# Patient Record
Sex: Male | Born: 1960 | Race: Black or African American | Hispanic: No | Smoking: Current every day smoker
Health system: Southern US, Community
[De-identification: ages and names within clinical notes are randomized; demographics above are authoritative.]

## PROBLEM LIST (undated history)

## (undated) DIAGNOSIS — S92009A Unspecified fracture of unspecified calcaneus, initial encounter for closed fracture: Secondary | ICD-10-CM

## (undated) DIAGNOSIS — I10 Essential (primary) hypertension: Secondary | ICD-10-CM

## (undated) DIAGNOSIS — E119 Type 2 diabetes mellitus without complications: Secondary | ICD-10-CM

## (undated) HISTORY — PX: TONSILLECTOMY: SUR1361

## (undated) HISTORY — PX: EYE SURGERY: SHX253

## (undated) HISTORY — PX: CYST EXCISION PERINEAL: SHX6278

---

## 1998-02-16 ENCOUNTER — Emergency Department (HOSPITAL_COMMUNITY): Admission: EM | Admit: 1998-02-16 | Discharge: 1998-02-16 | Payer: Self-pay | Admitting: Emergency Medicine

## 1998-11-21 ENCOUNTER — Emergency Department (HOSPITAL_COMMUNITY): Admission: EM | Admit: 1998-11-21 | Discharge: 1998-11-21 | Payer: Self-pay | Admitting: *Deleted

## 1998-11-21 ENCOUNTER — Encounter: Payer: Self-pay | Admitting: *Deleted

## 2000-07-15 ENCOUNTER — Encounter: Payer: Self-pay | Admitting: Emergency Medicine

## 2000-07-15 ENCOUNTER — Emergency Department (HOSPITAL_COMMUNITY): Admission: EM | Admit: 2000-07-15 | Discharge: 2000-07-15 | Payer: Self-pay | Admitting: Emergency Medicine

## 2000-08-27 ENCOUNTER — Emergency Department (HOSPITAL_COMMUNITY): Admission: EM | Admit: 2000-08-27 | Discharge: 2000-08-27 | Payer: Self-pay | Admitting: Emergency Medicine

## 2003-09-10 ENCOUNTER — Emergency Department (HOSPITAL_COMMUNITY): Admission: EM | Admit: 2003-09-10 | Discharge: 2003-09-10 | Payer: Self-pay | Admitting: Emergency Medicine

## 2003-10-20 ENCOUNTER — Ambulatory Visit (HOSPITAL_COMMUNITY): Admission: RE | Admit: 2003-10-20 | Discharge: 2003-10-20 | Payer: Self-pay | Admitting: General Surgery

## 2017-10-28 ENCOUNTER — Encounter (HOSPITAL_COMMUNITY): Payer: Self-pay

## 2017-10-28 ENCOUNTER — Emergency Department (HOSPITAL_COMMUNITY): Payer: 59

## 2017-10-28 ENCOUNTER — Emergency Department (HOSPITAL_COMMUNITY)
Admission: EM | Admit: 2017-10-28 | Discharge: 2017-10-28 | Disposition: A | Discharge status: Home / Self Care | Payer: 59 | Attending: Emergency Medicine | Admitting: Emergency Medicine

## 2017-10-28 DIAGNOSIS — R51 Headache: Secondary | ICD-10-CM | POA: Diagnosis present

## 2017-10-28 DIAGNOSIS — R079 Chest pain, unspecified: Secondary | ICD-10-CM | POA: Insufficient documentation

## 2017-10-28 DIAGNOSIS — R1032 Left lower quadrant pain: Secondary | ICD-10-CM | POA: Insufficient documentation

## 2017-10-28 LAB — COMPREHENSIVE METABOLIC PANEL
ALK PHOS: 81 U/L (ref 38–126)
ALT: 11 U/L — AB (ref 17–63)
ANION GAP: 7 (ref 5–15)
AST: 17 U/L (ref 15–41)
Albumin: 3.9 g/dL (ref 3.5–5.0)
BILIRUBIN TOTAL: 0.6 mg/dL (ref 0.3–1.2)
BUN: 9 mg/dL (ref 6–20)
CALCIUM: 9 mg/dL (ref 8.9–10.3)
CO2: 30 mmol/L (ref 22–32)
CREATININE: 1.15 mg/dL (ref 0.61–1.24)
Chloride: 105 mmol/L (ref 101–111)
GFR calc non Af Amer: >60 mL/min (ref >60)
GLUCOSE: 91 mg/dL (ref 65–99)
Potassium: 4.3 mmol/L (ref 3.5–5.1)
SODIUM: 142 mmol/L (ref 135–145)
TOTAL PROTEIN: 6.6 g/dL (ref 6.5–8.1)

## 2017-10-28 LAB — CBC WITH DIFFERENTIAL/PLATELET
Basophils Absolute: 0 10*3/uL (ref 0.0–0.1)
Basophils Relative: 0 %
Eosinophils Absolute: 0 10*3/uL (ref 0.0–0.7)
Eosinophils Relative: 0 %
HEMATOCRIT: 43.2 % (ref 39.0–52.0)
HEMOGLOBIN: 14.2 g/dL (ref 13.0–17.0)
LYMPHS ABS: 2.1 10*3/uL (ref 0.7–4.0)
LYMPHS PCT: 32 %
MCH: 31.3 pg (ref 26.0–34.0)
MCHC: 32.9 g/dL (ref 30.0–36.0)
MCV: 95.2 fL (ref 78.0–100.0)
MONOS PCT: 5 %
Monocytes Absolute: 0.4 10*3/uL (ref 0.1–1.0)
NEUTROS PCT: 63 %
Neutro Abs: 4.2 10*3/uL (ref 1.7–7.7)
Platelets: 223 10*3/uL (ref 150–400)
RBC: 4.54 MIL/uL (ref 4.22–5.81)
RDW: 14 % (ref 11.5–15.5)
WBC: 6.8 10*3/uL (ref 4.0–10.5)

## 2017-10-28 LAB — I-STAT TROPONIN, ED
TROPONIN I, POC: 0.01 ng/mL (ref 0.00–0.08)
Troponin i, poc: 0.01 ng/mL (ref 0.00–0.08)

## 2017-10-28 MED ORDER — MORPHINE SULFATE (PF) 4 MG/ML IV SOLN
4.0000 mg | Freq: Once | INTRAVENOUS | Status: AC
  Administered 2017-10-28: 4 mg via INTRAMUSCULAR
  Filled 2017-10-28: qty 1

## 2017-10-28 MED ORDER — IOHEXOL 300 MG/ML  SOLN
100.0000 mL | Freq: Once | INTRAMUSCULAR | Status: AC | PRN
  Administered 2017-10-28: 100 mL via INTRAVENOUS

## 2017-10-28 MED ORDER — ASPIRIN 81 MG PO CHEW
324.0000 mg | CHEWABLE_TABLET | Freq: Once | ORAL | Status: AC
  Administered 2017-10-28: 324 mg via ORAL
  Filled 2017-10-28: qty 4

## 2017-10-28 MED ORDER — MORPHINE SULFATE (PF) 4 MG/ML IV SOLN
4.0000 mg | Freq: Once | INTRAVENOUS | Status: AC
  Administered 2017-10-28: 4 mg via INTRAVENOUS
  Filled 2017-10-28: qty 1

## 2017-10-28 MED ORDER — METHOCARBAMOL 750 MG PO TABS: 750.0000 mg | ORAL_TABLET | Freq: Three times a day (TID) | ORAL | 0 refills | Status: DC | PRN

## 2017-10-28 NOTE — ED Notes (Signed)
Patient transported to CT 

## 2017-10-28 NOTE — Discharge Instructions (Addendum)
Please take Ibuprofen (Advil, motrin) and Tylenol (acetaminophen) to relieve your pain.  You may take up to 600 MG (3 pills) of normal strength ibuprofen every 8 hours as needed.  In between doses of ibuprofen you make take tylenol, up to 1,000 mg (two extra strength pills).  Do not take more than 3,000 mg tylenol in a 24 hour period.  Please check all medication labels as many medications such as pain and cold medications may contain tylenol.  Do not drink alcohol while taking these medications.  Do not take other NSAID'S while taking ibuprofen (such as aleve or naproxen).  Please take ibuprofen with food to decrease stomach upset.  Today you received medications that may make you sleepy or impair your ability to make decisions.  For the next 24 hours please do not drive, operate heavy machinery, care for a small child with out another adult present, or perform any activities that may cause harm to you or someone else if you were to fall asleep or be impaired.   You are being prescribed a medication which may make you sleepy. Please follow up of listed precautions for at least 24 hours after taking one dose.  The best way to get rid of muscle pain is by taking NSAIDS, using heat, massage therapy, and gentle stretching/range of motion exercises.  Please follow up with your primary care provider within 5-7 days for re-evaluation of your symptoms. If you do not have a primary care provider, information for a healthcare clinic has been provided for you to make arrangements for follow up care. Please return to the emergency department for any symptoms including chest pain, shortness of breath, lightheadedness, or any new or worsening symptoms.

## 2017-10-28 NOTE — ED Notes (Signed)
ED Provider at bedside. 
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
## 2017-10-28 NOTE — ED Notes (Signed)
Pt ambulated to bathroom without difficulty.

## 2018-08-14 ENCOUNTER — Other Ambulatory Visit: Payer: Self-pay

## 2018-08-14 ENCOUNTER — Encounter (HOSPITAL_COMMUNITY): Payer: Self-pay

## 2018-08-14 ENCOUNTER — Emergency Department (HOSPITAL_COMMUNITY): Payer: 59

## 2018-08-14 ENCOUNTER — Emergency Department (HOSPITAL_COMMUNITY)
Admission: EM | Admit: 2018-08-14 | Discharge: 2018-08-14 | Disposition: A | Discharge status: Home / Self Care | Payer: 59 | Attending: Emergency Medicine | Admitting: Emergency Medicine

## 2018-08-14 DIAGNOSIS — W174XXA Fall from dock, initial encounter: Secondary | ICD-10-CM | POA: Diagnosis not present

## 2018-08-14 DIAGNOSIS — Y999 Unspecified external cause status: Secondary | ICD-10-CM | POA: Insufficient documentation

## 2018-08-14 DIAGNOSIS — Y929 Unspecified place or not applicable: Secondary | ICD-10-CM | POA: Insufficient documentation

## 2018-08-14 DIAGNOSIS — S92215A Nondisplaced fracture of cuboid bone of left foot, initial encounter for closed fracture: Secondary | ICD-10-CM | POA: Insufficient documentation

## 2018-08-14 DIAGNOSIS — S92011A Displaced fracture of body of right calcaneus, initial encounter for closed fracture: Secondary | ICD-10-CM | POA: Insufficient documentation

## 2018-08-14 DIAGNOSIS — Y9389 Activity, other specified: Secondary | ICD-10-CM | POA: Diagnosis not present

## 2018-08-14 DIAGNOSIS — S99912A Unspecified injury of left ankle, initial encounter: Secondary | ICD-10-CM | POA: Diagnosis present

## 2018-08-14 MED ORDER — OXYCODONE-ACETAMINOPHEN 5-325 MG PO TABS: 1.0000 | ORAL_TABLET | ORAL | 0 refills | Status: DC | PRN

## 2018-08-14 MED ORDER — OXYCODONE-ACETAMINOPHEN 5-325 MG PO TABS
1.0000 | ORAL_TABLET | ORAL | Status: AC | PRN
  Administered 2018-08-14 (×2): 1 via ORAL
  Filled 2018-08-14 (×2): qty 1

## 2018-08-14 NOTE — ED Triage Notes (Signed)
Pt reports L ankle pain. He reports stepping off a dock and rolling it. Ankle swollen. Not ambulatory,

## 2018-08-14 NOTE — ED Provider Notes (Signed)
[facility]-EMERGENCY DEPT Provider Note   CSN: [id number] Arrival date & time: 08/14/18  1929    History   Chief Complaint Chief Complaint  Patient presents with  . Ankle Pain    L    HPI Martin Freeman is a 58 y.o. male.     Pt presents to the ED today with right ankle pain.  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  The pt denies any other injuries.     History reviewed. No pertinent past medical history.  There are no active problems to display for this patient.   History reviewed. No pertinent surgical history.      Home Medications    Prior to Admission medications   Medication Sig Start Date End Date Taking? Authorizing Provider  methocarbamol (ROBAXIN) 750 MG tablet Take 1-2 tablets (750-1,500 mg total) by mouth 3 (three) times daily as needed for muscle spasms. 10/28/17   Cristina Gong, PA-C  oxyCODONE-acetaminophen (PERCOCET/ROXICET) 5-325 MG tablet Take 1-2 tablets by mouth every 4 (four) hours as needed for severe pain. 08/14/18   Jacalyn Lefevre, MD    Family History History reviewed. No pertinent family history.  Social History Social History   Tobacco Use  . Smoking status: Not on file  Substance Use Topics  . Alcohol use: Not on file  . Drug use: Not on file     Allergies   Patient has no known allergies.   Review of Systems Review of Systems  Musculoskeletal:       Right ankle pain  All other systems reviewed and are negative.    Physical Exam Updated Vital Signs BP (!) 150/92 (BP Location: Left Arm)   Pulse 90   Temp 98.9 F (37.2 C) (Oral)   Resp 18   Ht 6\' 3"  (1.905 m)   Wt 99.8 kg   SpO2 100%   BMI 27.50 kg/m   Physical Exam Vitals signs and nursing note reviewed.  Constitutional:      Appearance: Normal appearance.  HENT:     Head: Normocephalic and atraumatic.     Right Ear: External ear normal.     Left  Ear: External ear normal.     Nose: Nose normal.     Mouth/Throat:     Mouth: Mucous membranes are moist.  Eyes:     Pupils: Pupils are equal, round, and reactive to light.  Neck:     Musculoskeletal: Normal range of motion and neck supple.  Cardiovascular:     Rate and Rhythm: Normal rate and regular rhythm.     Pulses: Normal pulses.     Heart sounds: Normal heart sounds.  Pulmonary:     Effort: Pulmonary effort is normal.     Breath sounds: Normal breath sounds.  Abdominal:     General: Abdomen is flat.  Musculoskeletal:     Right ankle: He exhibits decreased range of motion and swelling. Tenderness.  Skin:    General: Skin is warm.     Capillary Refill: Capillary refill takes less than 2 seconds.  Neurological:     General: No focal deficit present.     Mental Status: He is alert and oriented to person, place, and time.  Psychiatric:        Mood and Affect: Mood normal.        Behavior: Behavior normal.      ED Treatments / Results  Labs (all labs ordered are listed, but only abnormal results are displayed) Labs Reviewed - No data to display  EKG None  Radiology Dg Ankle Complete Left  Result Date: 08/14/2018 CLINICAL DATA:  Left ankle pain EXAM: LEFT ANKLE COMPLETE - 3+ VIEW COMPARISON:  None. FINDINGS: Comminuted fracture noted through the left calcaneus. Should possible fracture of the adjacent cuboid seen only on the oblique view. Diffuse soft tissue swelling. No visible additional fracture, subluxation or dislocation. IMPRESSION: Comminuted fracture through the left calcaneus. Possible fracture within the left cuboid. Electronically Signed   By: Charlett Nose M.D.   On: 08/14/2018 20:42    Procedures Procedures (including critical care time)  Medications Ordered in ED Medications  oxyCODONE-acetaminophen (PERCOCET/ROXICET) 5-325 MG per tablet 1 tablet (1 tablet Oral Given 08/14/18 2003)     Initial Impression / Assessment and Plan / ED Course  I have  reviewed the triage vital signs and the nursing notes.  Pertinent labs & imaging results that were available during my care of the patient were reviewed by me and considered in my medical decision making (see chart for details).       Pt placed in a posterior splint and given crutches.  He has seen [facility] in the past and wishes to go back there.  The pt is instructed to stay [facility].  Return if worse.    Final Clinical Impressions(s) / ED Diagnoses   Final diagnoses:  Closed displaced fracture of body of right calcaneus, initial encounter  Closed nondisplaced fracture of cuboid of left foot, initial encounter    ED Discharge Orders         Ordered    oxyCODONE-acetaminophen (PERCOCET/ROXICET) 5-325 MG tablet  Every 4 hours PRN     08/14/18 2105           Jacalyn Lefevre, MD 08/14/18 2108

## 2018-08-16 ENCOUNTER — Ambulatory Visit
Admission: RE | Admit: 2018-08-16 | Discharge: 2018-08-16 | Disposition: A | Discharge status: Home / Self Care | Payer: 59 | Source: Ambulatory Visit | Attending: Student | Admitting: Student

## 2018-08-16 ENCOUNTER — Other Ambulatory Visit: Payer: Self-pay | Admitting: Student

## 2018-08-16 DIAGNOSIS — S92002A Unspecified fracture of left calcaneus, initial encounter for closed fracture: Secondary | ICD-10-CM

## 2018-08-17 ENCOUNTER — Other Ambulatory Visit: Payer: Self-pay | Admitting: Student

## 2018-08-17 DIAGNOSIS — S92002A Unspecified fracture of left calcaneus, initial encounter for closed fracture: Secondary | ICD-10-CM

## 2018-08-20 ENCOUNTER — Other Ambulatory Visit: Payer: Self-pay

## 2018-08-20 ENCOUNTER — Other Ambulatory Visit (HOSPITAL_COMMUNITY): Payer: Self-pay | Admitting: Orthopedic Surgery

## 2018-08-20 ENCOUNTER — Encounter (HOSPITAL_BASED_OUTPATIENT_CLINIC_OR_DEPARTMENT_OTHER): Payer: Self-pay | Admitting: *Deleted

## 2018-08-25 ENCOUNTER — Ambulatory Visit (HOSPITAL_BASED_OUTPATIENT_CLINIC_OR_DEPARTMENT_OTHER)
Admission: RE | Admit: 2018-08-25 | Discharge: 2018-08-25 | Disposition: A | Discharge status: Home / Self Care | Payer: Managed Care, Other (non HMO) | Attending: Orthopedic Surgery | Admitting: Orthopedic Surgery

## 2018-08-25 ENCOUNTER — Ambulatory Visit (HOSPITAL_BASED_OUTPATIENT_CLINIC_OR_DEPARTMENT_OTHER): Payer: Managed Care, Other (non HMO) | Admitting: Certified Registered"

## 2018-08-25 ENCOUNTER — Other Ambulatory Visit: Payer: Self-pay

## 2018-08-25 ENCOUNTER — Encounter (HOSPITAL_BASED_OUTPATIENT_CLINIC_OR_DEPARTMENT_OTHER): Payer: Self-pay | Admitting: Certified Registered"

## 2018-08-25 ENCOUNTER — Encounter (HOSPITAL_BASED_OUTPATIENT_CLINIC_OR_DEPARTMENT_OTHER)
Admission: RE | Disposition: A | Discharge status: Home / Self Care | Payer: Self-pay | Source: Home / Self Care | Attending: Orthopedic Surgery

## 2018-08-25 DIAGNOSIS — I10 Essential (primary) hypertension: Secondary | ICD-10-CM | POA: Diagnosis not present

## 2018-08-25 DIAGNOSIS — W1789XA Other fall from one level to another, initial encounter: Secondary | ICD-10-CM | POA: Insufficient documentation

## 2018-08-25 DIAGNOSIS — S92062A Displaced intraarticular fracture of left calcaneus, initial encounter for closed fracture: Secondary | ICD-10-CM | POA: Diagnosis not present

## 2018-08-25 DIAGNOSIS — F17210 Nicotine dependence, cigarettes, uncomplicated: Secondary | ICD-10-CM | POA: Insufficient documentation

## 2018-08-25 HISTORY — PX: ORIF CALCANEOUS FRACTURE: SHX5030

## 2018-08-25 HISTORY — DX: Essential (primary) hypertension: I10

## 2018-08-25 HISTORY — DX: Unspecified fracture of unspecified calcaneus, initial encounter for closed fracture: S92.009A

## 2018-08-25 SURGERY — OPEN REDUCTION INTERNAL FIXATION (ORIF) CALCANEOUS FRACTURE
Anesthesia: General | Site: Foot | Laterality: Left

## 2018-08-25 MED ORDER — ROPIVACAINE HCL 5 MG/ML IJ SOLN
INTRAMUSCULAR | Status: DC | PRN
  Administered 2018-08-25: 30 mL via PERINEURAL

## 2018-08-25 MED ORDER — CHLORHEXIDINE GLUCONATE 4 % EX LIQD: 60.0000 mL | Freq: Once | CUTANEOUS | Status: DC

## 2018-08-25 MED ORDER — SCOPOLAMINE 1 MG/3DAYS TD PT72: 1.0000 | MEDICATED_PATCH | Freq: Once | TRANSDERMAL | Status: DC | PRN

## 2018-08-25 MED ORDER — FENTANYL CITRATE (PF) 100 MCG/2ML IJ SOLN
INTRAMUSCULAR | Status: AC
  Filled 2018-08-25: qty 2

## 2018-08-25 MED ORDER — MIDAZOLAM HCL 2 MG/2ML IJ SOLN
1.0000 mg | INTRAMUSCULAR | Status: DC | PRN
  Administered 2018-08-25: 2 mg via INTRAVENOUS

## 2018-08-25 MED ORDER — LACTATED RINGERS IV SOLN
INTRAVENOUS | Status: DC
  Administered 2018-08-25 (×2): via INTRAVENOUS

## 2018-08-25 MED ORDER — FENTANYL CITRATE (PF) 100 MCG/2ML IJ SOLN
50.0000 ug | INTRAMUSCULAR | Status: DC | PRN
  Administered 2018-08-25: 50 ug via INTRAVENOUS

## 2018-08-25 MED ORDER — SODIUM CHLORIDE 0.9 % IV SOLN: INTRAVENOUS | Status: DC

## 2018-08-25 MED ORDER — OXYCODONE HCL 5 MG PO TABS: 5.0000 mg | ORAL_TABLET | Freq: Four times a day (QID) | ORAL | 0 refills | Status: AC | PRN

## 2018-08-25 MED ORDER — VANCOMYCIN HCL 500 MG IV SOLR
INTRAVENOUS | Status: DC | PRN
  Administered 2018-08-25: 1000 mg

## 2018-08-25 MED ORDER — DEXAMETHASONE SODIUM PHOSPHATE 10 MG/ML IJ SOLN
INTRAMUSCULAR | Status: DC | PRN
  Administered 2018-08-25: 10 mg via INTRAVENOUS

## 2018-08-25 MED ORDER — MIDAZOLAM HCL 2 MG/2ML IJ SOLN
INTRAMUSCULAR | Status: AC
  Filled 2018-08-25: qty 2

## 2018-08-25 MED ORDER — PROPOFOL 10 MG/ML IV BOLUS
INTRAVENOUS | Status: DC | PRN
  Administered 2018-08-25: 200 mg via INTRAVENOUS

## 2018-08-25 MED ORDER — ASPIRIN EC 81 MG PO TBEC: 81.0000 mg | DELAYED_RELEASE_TABLET | Freq: Two times a day (BID) | ORAL | 0 refills | Status: AC

## 2018-08-25 MED ORDER — CEFAZOLIN SODIUM-DEXTROSE 2-4 GM/100ML-% IV SOLN
INTRAVENOUS | Status: AC
  Filled 2018-08-25: qty 100

## 2018-08-25 MED ORDER — PROPOFOL 500 MG/50ML IV EMUL
INTRAVENOUS | Status: DC | PRN
  Administered 2018-08-25: 25 ug/kg/min via INTRAVENOUS

## 2018-08-25 MED ORDER — LIDOCAINE HCL (CARDIAC) PF 100 MG/5ML IV SOSY
PREFILLED_SYRINGE | INTRAVENOUS | Status: DC | PRN
  Administered 2018-08-25: 30 mg via INTRAVENOUS

## 2018-08-25 MED ORDER — CEFAZOLIN SODIUM-DEXTROSE 2-4 GM/100ML-% IV SOLN
2.0000 g | INTRAVENOUS | Status: AC
  Administered 2018-08-25: 2 g via INTRAVENOUS

## 2018-08-25 MED ORDER — ONDANSETRON HCL 4 MG/2ML IJ SOLN
INTRAMUSCULAR | Status: DC | PRN
  Administered 2018-08-25: 4 mg via INTRAVENOUS

## 2018-08-25 SURGICAL SUPPLY — 73 items
BANDAGE ESMARK 6X9 LF (GAUZE/BANDAGES/DRESSINGS) ×1 IMPLANT
BIT DRILL CALIBRATED 2.7 (BIT) ×2 IMPLANT
BIT DRILL CALIBRATED 2.7MM (BIT) ×1
BLADE SURG 10 STRL SS (BLADE) ×2 IMPLANT
BLADE SURG 15 STRL LF DISP TIS (BLADE) ×2 IMPLANT
BLADE SURG 15 STRL SS (BLADE) ×9
BNDG CMPR 9X6 STRL LF SNTH (GAUZE/BANDAGES/DRESSINGS) ×1
BNDG COHESIVE 4X5 TAN STRL (GAUZE/BANDAGES/DRESSINGS) ×3 IMPLANT
BNDG COHESIVE 6X5 TAN STRL LF (GAUZE/BANDAGES/DRESSINGS) ×3 IMPLANT
BNDG ESMARK 6X9 LF (GAUZE/BANDAGES/DRESSINGS) ×3
CHLORAPREP W/TINT 26ML (MISCELLANEOUS) ×3 IMPLANT
COVER BACK TABLE 60X90IN (DRAPES) ×3 IMPLANT
COVER WAND RF STERILE (DRAPES) IMPLANT
CUFF TOURNIQUET SINGLE 34IN LL (TOURNIQUET CUFF) ×3 IMPLANT
DECANTER SPIKE VIAL GLASS SM (MISCELLANEOUS) IMPLANT
DRAPE EXTREMITY T 121X128X90 (DISPOSABLE) ×3 IMPLANT
DRAPE OEC MINIVIEW 54X84 (DRAPES) ×3 IMPLANT
DRAPE U-SHAPE 47X51 STRL (DRAPES) ×3 IMPLANT
DRSG MEPITEL 4X7.2 (GAUZE/BANDAGES/DRESSINGS) ×3 IMPLANT
DRSG PAD ABDOMINAL 8X10 ST (GAUZE/BANDAGES/DRESSINGS) ×6 IMPLANT
ELECT REM PT RETURN 9FT ADLT (ELECTROSURGICAL) ×3
ELECTRODE REM PT RTRN 9FT ADLT (ELECTROSURGICAL) ×1 IMPLANT
GAUZE SPONGE 4X4 12PLY STRL (GAUZE/BANDAGES/DRESSINGS) ×3 IMPLANT
GLOVE BIO SURGEON STRL SZ 6.5 (GLOVE) ×2 IMPLANT
GLOVE BIO SURGEON STRL SZ8 (GLOVE) ×3 IMPLANT
GLOVE BIO SURGEONS STRL SZ 6.5 (GLOVE) ×2
GLOVE BIOGEL PI IND STRL 7.0 (GLOVE) IMPLANT
GLOVE BIOGEL PI IND STRL 8 (GLOVE) ×2 IMPLANT
GLOVE BIOGEL PI INDICATOR 7.0 (GLOVE) ×2
GLOVE BIOGEL PI INDICATOR 8 (GLOVE) ×4
GLOVE ECLIPSE 8.0 STRL XLNG CF (GLOVE) ×3 IMPLANT
GOWN STRL REUS W/ TWL LRG LVL3 (GOWN DISPOSABLE) ×1 IMPLANT
GOWN STRL REUS W/ TWL XL LVL3 (GOWN DISPOSABLE) ×2 IMPLANT
GOWN STRL REUS W/TWL LRG LVL3 (GOWN DISPOSABLE) ×3
GOWN STRL REUS W/TWL XL LVL3 (GOWN DISPOSABLE) ×6
K-WIRE ACE 1.6X6 (WIRE) ×18
KWIRE ACE 1.6X6 (WIRE) IMPLANT
NS IRRIG 1000ML POUR BTL (IV SOLUTION) ×3 IMPLANT
PACK BASIN DAY SURGERY FS (CUSTOM PROCEDURE TRAY) ×3 IMPLANT
PAD CAST 4YDX4 CTTN HI CHSV (CAST SUPPLIES) ×1 IMPLANT
PADDING CAST ABS 4INX4YD NS (CAST SUPPLIES) ×2
PADDING CAST ABS COTTON 4X4 ST (CAST SUPPLIES) ×1 IMPLANT
PADDING CAST COTTON 4X4 STRL (CAST SUPPLIES) ×3
PADDING CAST COTTON 6X4 STRL (CAST SUPPLIES) ×3 IMPLANT
PENCIL BUTTON HOLSTER BLD 10FT (ELECTRODE) ×3 IMPLANT
PLATE CALC LRG 3H RT LOCKING (Plate) ×3 IMPLANT
SANITIZER HAND PURELL 535ML FO (MISCELLANEOUS) ×3 IMPLANT
SCREW LOCK CORT STAR 3.5X38 (Screw) ×2 IMPLANT
SCREW LOCK CORT STAR 3.5X40 (Screw) ×2 IMPLANT
SCREW T15 LP CORT 3.5X38MM NS (Screw) ×2 IMPLANT
SCREW T15 LP CORT 3.5X40MM NS (Screw) ×4 IMPLANT
SCREW T15 LP CORT 3.5X42MM NS (Screw) ×2 IMPLANT
SCREW T15 LP CORT 3.5X44MM NS (Screw) ×4 IMPLANT
SHEET MEDIUM DRAPE 40X70 STRL (DRAPES) ×3 IMPLANT
SLEEVE SCD COMPRESS KNEE MED (MISCELLANEOUS) ×3 IMPLANT
SPLINT FAST PLASTER 5X30 (CAST SUPPLIES) ×40
SPLINT PLASTER CAST FAST 5X30 (CAST SUPPLIES) ×20 IMPLANT
SPONGE LAP 18X18 RF (DISPOSABLE) ×3 IMPLANT
STOCKINETTE 6  STRL (DRAPES) ×2
STOCKINETTE 6 STRL (DRAPES) ×1 IMPLANT
SUCTION FRAZIER HANDLE 10FR (MISCELLANEOUS) ×2
SUCTION TUBE FRAZIER 10FR DISP (MISCELLANEOUS) ×1 IMPLANT
SUT ETHILON 3 0 PS 1 (SUTURE) ×3 IMPLANT
SUT MNCRL AB 3-0 PS2 18 (SUTURE) ×3 IMPLANT
SUT VIC AB 0 SH 27 (SUTURE) ×3 IMPLANT
SUT VIC AB 2-0 SH 18 (SUTURE) IMPLANT
SUT VIC AB 2-0 SH 27 (SUTURE)
SUT VIC AB 2-0 SH 27XBRD (SUTURE) IMPLANT
SYR BULB 3OZ (MISCELLANEOUS) ×3 IMPLANT
TOWEL GREEN STERILE FF (TOWEL DISPOSABLE) ×6 IMPLANT
TUBE CONNECTING 20'X1/4 (TUBING) ×1
TUBE CONNECTING 20X1/4 (TUBING) ×2 IMPLANT
UNDERPAD 30X30 (UNDERPADS AND DIAPERS) ×3 IMPLANT

## 2018-08-25 NOTE — Anesthesia Procedure Notes (Signed)
Anesthesia Regional Block: Popliteal block   Pre-Anesthetic Checklist: ,, timeout performed, Correct Patient, Correct Site, Correct Laterality, Correct Procedure, Correct Position, site marked, Risks and benefits discussed,  Surgical consent,  Pre-op evaluation,  At surgeon's request and post-op pain management  Laterality: Left  Prep: chloraprep       Needles:  Injection technique: Single-shot  Needle Type: Stimiplex     Needle Length: 9cm  Needle Gauge: 21     Additional Needles:   Procedures:,,,, ultrasound used (permanent image in chart),,,,  Narrative:  Start time: 08/25/2018 9:03 AM End time: 08/25/2018 9:08 AM Injection made incrementally with aspirations every 5 mL.  Performed by: Personally  Anesthesiologist: Lowella Curb, MD

## 2018-08-25 NOTE — Transfer of Care (Signed)
Immediate Anesthesia Transfer of Care Note  Patient: Martin Freeman  Procedure(s) Performed: OPEN REDUCTION INTERNAL FIXATION (ORIF) Left calcaneus fracture (Left Foot)  Patient Location: PACU  Anesthesia Type:GA combined with regional for post-op pain  Level of Consciousness: sedated and patient cooperative  Airway & Oxygen Therapy: Patient Spontanous Breathing and Patient connected to face mask oxygen  Post-op Assessment: Report given to RN and Post -op Vital signs reviewed and stable  Post vital signs: Reviewed and stable  Last Vitals:  Vitals Value Taken Time  BP 109/50 08/25/2018 11:52 AM  Temp    Pulse 81 08/25/2018 11:54 AM  Resp 20 08/25/2018 11:54 AM  SpO2 98 % 08/25/2018 11:54 AM  Vitals shown include unvalidated device data.  Last Pain:  Vitals:   08/25/18 0807  TempSrc: Oral  PainSc: 3       Patients Stated Pain Goal: 1 (08/25/18 4034)  Complications: No apparent anesthesia complications

## 2018-08-25 NOTE — Op Note (Signed)
08/25/2018  11:53 AM  PATIENT:  Martin Freeman  58 y.o. male  PRE-OPERATIVE DIAGNOSIS: Closed displaced intra-articular fracture of the left calcaneus  POST-OPERATIVE DIAGNOSIS: Same  Procedure(s): 1.  Open treatment of left calcaneus fracture with internal fixation 2.  AP, lateral, Harris heel and Broden view x-rays of the left foot  SURGEON:  Toni Arthurs, MD  ASSISTANT: None  ANESTHESIA:   General, regional  EBL:  minimal   TOURNIQUET:   Total Tourniquet Time Documented: Thigh (Left) - 112 minutes Total: Thigh (Left) - 112 minutes  COMPLICATIONS:  None apparent  DISPOSITION:  Extubated, awake and stable to recovery.  INDICATION FOR PROCEDURE: The patient is a 58 year old male with a past medical history significant for smoking.  He injured his left foot when he fell from a loading dock approximately 2 weeks ago.  X-rays and a CT scan reveal a displaced intra-articular fracture of the left calcaneus involving the posterior facet.  He presents now for operative treatment of this displaced and unstable intra-articular fracture.  The risks and benefits of the alternative treatment options have been discussed in detail.  The patient wishes to proceed with surgery and specifically understands risks of bleeding, infection, nerve damage, blood clots, need for additional surgery, amputation and death.  PROCEDURE IN DETAIL:  After pre operative consent was obtained, and the correct operative site was identified, the patient was brought to the operating room and placed supine on the OR table.  Anesthesia was administered.  Pre-operative antibiotics were administered.  A surgical timeout was taken.  The left lower extremity was prepped and draped in standard sterile fashion with a tourniquet around the thigh.  The extremity was elevated and the tourniquet was inflated to 250 mmHg.  A curvilinear incision was then made adjacent to the sinus Tarsi at the lateral aspect of the hindfoot.   Dissection was carried down through the subcutaneous tissues taking care to protect branches of the sural nerve.  The interval between the peroneals and the extensor digitorum brevis was developed, and the sinus Tarsi was entered.  Subperiosteal dissection was then carried along the lateral wall of the calcaneus.  The fracture site was mobilized and cleaned of all hematoma.  It was irrigated copiously.  A K wire was inserted transversely through the calcaneal tuberosity.  A traction bow was placed over the K wire and used to pull the tuberosity fragment out to length and correct the varus malalignment.  The tuberosity fragment was then pinned to the constant fragment.  The anterior process fragment was reduced and used as a template to reduce the posterior facet/lateral wall fragment.  This restored the angle of Ghissane.  The anterior process fragment was pinned as well.  Lateral, Harris heel and Broden views were obtained showing appropriate reduction of the fractures.  A size large 3 hole plate from the [employer] calcaneus set was selected.  It was inserted along the lateral wall of the calcaneus and provisionally pinned using the targeting jig.  Radiographs confirmed appropriate position of the plate.  The hole beneath the posterior facet was drilled and a nonlocking screw inserted.  This pulled the plate securely down to the bone.  The hole just distal to the angle was selected and drilled.  Again a nonlocking screw was inserted.  The most distal and plantar hole was drilled and another nonlocking screw inserted.  A stab incision was then made and 2 screws placed in the tuberosity.  A final screw was placed beneath  the posterior facet and the most apical hole.  The targeting jig was removed  Final AP foot, lateral foot, Broden and Harris heel views were obtained.  These show appropriate reduction and fixation of all of the fracture fragments.  Hardware is appropriately positioned and of the appropriate  lengths.  The wound was then irrigated copiously.  Vancomycin powder was sprinkled in the deep and superficial layers of the wound.  The deep subcutaneous tissues were approximated over the plate with inverted simple sutures of 2-0 Vicryl.  The superficial subcutaneous tissues were approximated with inverted simple sutures of 3-0 Monocryl.  The skin incision was closed with horizontal mattress sutures of 3-0 nylon.  Sterile dressings were applied followed by a well-padded short leg splint.  The tourniquet was released after application of the dressings.  The patient was awakened from anesthesia and transported to the recovery room in stable condition.   FOLLOW UP PLAN: Nonweightbearing on the left lower extremity.  Follow-up in 2 weeks for suture removal.  Aspirin 81 mg p.o. twice daily for DVT prophylaxis since the patient has quit smoking.  Plan nonweightbearing for 2 to 3 months postop.   RADIOGRAPHS: AP foot, lateral foot, Harris heel and Broden views were obtained intraoperatively.  These show interval reduction and fixation of the left calcaneus fracture.  Hardware is appropriately positioned and of the appropriate lengths.

## 2018-08-25 NOTE — Anesthesia Postprocedure Evaluation (Signed)
Anesthesia Post Note  Patient: Martin Freeman  Procedure(s) Performed: OPEN REDUCTION INTERNAL FIXATION (ORIF) Left calcaneus fracture (Left Foot)     Patient location during evaluation: PACU Anesthesia Type: General Level of consciousness: awake and alert Pain management: pain level controlled Vital Signs Assessment: post-procedure vital signs reviewed and stable Respiratory status: spontaneous breathing, nonlabored ventilation and respiratory function stable Cardiovascular status: blood pressure returned to baseline and stable Postop Assessment: no apparent nausea or vomiting Anesthetic complications: no    Last Vitals:  Vitals:   08/25/18 1300 08/25/18 1315  BP: (!) 143/85 (!) 142/82  Pulse: 77 76  Resp: 18 (!) 22  Temp:    SpO2: 96% 99%    Last Pain:  Vitals:   08/25/18 1315  TempSrc:   PainSc: 0-No pain                 Lowella Curb

## 2018-08-25 NOTE — Progress Notes (Signed)
Assisted Dr. Miller with left, ultrasound guided, popliteal block. Side rails up, monitors on throughout procedure. See vital signs in flow sheet. Tolerated Procedure well. 

## 2018-08-25 NOTE — Discharge Instructions (Addendum)
Martin Hewitt, MD °[facility] ° °Please read the following information regarding your care after surgery. ° °Medications  °You only need a prescription for the narcotic pain medicine (ex. oxycodone, Percocet, Norco).  All of the other medicines listed below are available over the counter. °X Aleve 2 pills twice a day for the first 3 days after surgery. °X acetominophen (Tylenol) 650 mg every 4-6 hours as you need for minor to moderate pain °X oxycodone as prescribed for severe pain ° °Narcotic pain medicine (ex. oxycodone, Percocet, Vicodin) will cause constipation.  To prevent this problem, take the following medicines while you are taking any pain medicine. °X docusate sodium (Colace) 100 mg twice a day X senna (Senokot) 2 tablets twice a day ° °X To help prevent blood clots, take a baby aspirin (81 mg) twice a day for two weeks after surgery.  You should also get up every hour while you are awake to move around.   ° °Weight Bearing °? Bear weight when you are able on your operated leg or foot. °? Bear weight only on your operated foot in the post-op shoe. °X Do not bear any weight on the operated leg or foot. ° °Cast / Splint / Dressing °X Keep your splint, cast or dressing clean and dry.  Don’t put anything (coat hanger, pencil, etc) down inside of it.  If it gets damp, use a hair dryer on the cool setting to dry it.  If it gets soaked, call the office to schedule an appointment for a cast change. °? Remove your dressing 3 days after surgery and cover the incisions with dry dressings.   ° °After your dressing, cast or splint is removed; you may shower, but do not soak or scrub the wound.  Allow the water to run over it, and then gently pat it dry. ° °Swelling °It is normal for you to have swelling where you had surgery.  To reduce swelling and pain, keep your toes above your nose for at least 3 days after surgery.  It may be necessary to keep your foot or leg elevated for several weeks.  If it hurts,  it should be elevated. ° °Follow Up °Call my office at [phone] when you are discharged from the hospital or surgery center to schedule an appointment to be seen two weeks after surgery. ° °Call my office at [phone] if you develop a fever >101.5° F, nausea, vomiting, bleeding from the surgical site or severe pain.   ° ° ° °Post Anesthesia Home Care Instructions ° °Activity: °Get plenty of rest for the remainder of the day. A responsible individual must stay with you for 24 hours following the procedure.  °For the next 24 hours, DO NOT: °-Drive a car °-Operate machinery °-Drink alcoholic beverages °-Take any medication unless instructed by your physician °-Make any legal decisions or sign important papers. ° °Meals: °Start with liquid foods such as gelatin or soup. Progress to regular foods as tolerated. Avoid greasy, spicy, heavy foods. If nausea and/or vomiting occur, drink only clear liquids until the nausea and/or vomiting subsides. Call your physician if vomiting continues. ° °Special Instructions/Symptoms: °Your throat may feel dry or sore from the anesthesia or the breathing tube placed in your throat during surgery. If this causes discomfort, gargle with warm salt water. The discomfort should disappear within 24 hours. ° °If you had a scopolamine patch placed behind your ear for the management of post- operative nausea and/or vomiting: ° °1. The medication in the   patch is effective for 72 hours, after which it should be removed.  Wrap patch in a tissue and discard in the trash. Wash hands thoroughly with soap and water. °2. You may remove the patch earlier than 72 hours if you experience unpleasant side effects which may include dry mouth, dizziness or visual disturbances. °3. Avoid touching the patch. Wash your hands with soap and water after contact with the patch. °  ° ° °Regional Anesthesia Blocks ° °1. Numbness or the inability to move the "blocked" extremity may last from 3-48 hours after  placement. The length of time depends on the medication injected and your individual response to the medication. If the numbness is not going away after 48 hours, call your surgeon. ° °2. The extremity that is blocked will need to be protected until the numbness is gone and the  Strength has returned. Because you cannot feel it, you will need to take extra care to avoid injury. Because it may be weak, you may have difficulty moving it or using it. You may not know what position it is in without looking at it while the block is in effect. ° °3. For blocks in the legs and feet, returning to weight bearing and walking needs to be done carefully. You will need to wait until the numbness is entirely gone and the strength has returned. You should be able to move your leg and foot normally before you try and bear weight or walk. You will need someone to be with you when you first try to ensure you do not fall and possibly risk injury. ° °4. Bruising and tenderness at the needle site are common side effects and will resolve in a few days. ° °5. Persistent numbness or new problems with movement should be communicated to the surgeon or the [facility] ([phone])/ [facility] ([phone]). °

## 2018-08-25 NOTE — H&P (Signed)
Martin Freeman is an 58 y.o. male.   Chief Complaint: Left foot injury HPI: The patient is a 58 year old male with a past medical history significant for smoking.  He injured his left foot approximately 2 weeks ago when he fell off a loading dock.  He has a displaced calcaneus fracture intra-articular at the posterior facet.  He presents today for operative treatment of this displaced and unstable injury.  Past Medical History:  Diagnosis Date  . Calcaneal fracture    left  . Hypertension    states he stopped BP med on his own    Past Surgical History:  Procedure Laterality Date  . CYST EXCISION PERINEAL     groin  . EYE SURGERY    . TONSILLECTOMY      History reviewed. No pertinent family history. Social History:  reports that he has been smoking. He has been smoking about 0.50 packs per day. He has never used smokeless tobacco. He reports previous alcohol use. He reports that he does not use drugs.  Allergies: No Known Allergies  Medications Prior to Admission  Medication Sig Dispense Refill  . oxyCODONE-acetaminophen (PERCOCET/ROXICET) 5-325 MG tablet Take 1-2 tablets by mouth every 4 (four) hours as needed for severe pain. 15 tablet 0    No results found for this or any previous visit (from the past 48 hour(s)). No results found.  ROS no recent fever, chills, nausea, vomiting or changes in his appetite.  Blood pressure (!) 141/90, pulse 76, temperature 99 F (37.2 C), temperature source Oral, resp. rate 19, height 6\' 3"  (1.905 m), weight 100.6 kg, SpO2 100 %. Physical Exam  Well-nourished well-developed man in no apparent distress.  Alert and oriented x4.  Mood and affect are normal.  Extraocular motions are intact.  Respirations are unlabored.  Gait is nonweightbearing on the left.  The left foot has moderate swelling.  Blisters have resolved.  The skin wrinkles.  Sensibility to light touch is intact in the medial and lateral plantar nerve distributions.  5 out of 5  strength in plantar flexion and dorsiflexion of the toes.  No lymphadenopathy.  Assessment/Plan Left foot displaced intra-articular calcaneus fracture -to the operating room today for open treatment with internal fixation of this displaced and unstable injury.  The risks and benefits of the alternative treatment options have been discussed in detail.  The patient wishes to proceed with surgery and specifically understands risks of bleeding, infection, nerve damage, blood clots, need for additional surgery, amputation and death.   Toni Arthurs, MD 26-Aug-2018, 9:21 AM

## 2018-08-25 NOTE — Anesthesia Procedure Notes (Signed)
Procedure Name: LMA Insertion Date/Time: 08/25/2018 9:30 AM Performed by: Sheryn Bison, CRNA Pre-anesthesia Checklist: Patient identified, Emergency Drugs available, Suction available and Patient being monitored Patient Re-evaluated:Patient Re-evaluated prior to induction Oxygen Delivery Method: Circle system utilized Preoxygenation: Pre-oxygenation with 100% oxygen Induction Type: IV induction Ventilation: Mask ventilation without difficulty LMA: LMA inserted LMA Size: 5.0 Number of attempts: 1 Airway Equipment and Method: Bite block Placement Confirmation: positive ETCO2 Tube secured with: Tape Dental Injury: Teeth and Oropharynx as per pre-operative assessment

## 2018-08-25 NOTE — Anesthesia Preprocedure Evaluation (Signed)
Anesthesia Evaluation  Patient identified by MRN, date of birth, ID band Patient awake    Reviewed: Allergy & Precautions, NPO status , Patient's Chart, lab work & pertinent test results  Airway Mallampati: II  TM Distance: >3 FB Neck ROM: Full    Dental no notable dental hx.    Pulmonary neg pulmonary ROS, Current Smoker,    Pulmonary exam normal breath sounds clear to auscultation       Cardiovascular hypertension, negative cardio ROS Normal cardiovascular exam Rhythm:Regular Rate:Normal     Neuro/Psych negative neurological ROS  negative psych ROS   GI/Hepatic negative GI ROS, Neg liver ROS,   Endo/Other  negative endocrine ROS  Renal/GU negative Renal ROS  negative genitourinary   Musculoskeletal negative musculoskeletal ROS (+)   Abdominal   Peds negative pediatric ROS (+)  Hematology negative hematology ROS (+)   Anesthesia Other Findings   Reproductive/Obstetrics negative OB ROS                             Anesthesia Physical Anesthesia Plan  ASA: II  Anesthesia Plan: General   Post-op Pain Management:  Regional for Post-op pain   Induction: Intravenous  PONV Risk Score and Plan: 1 and Ondansetron  Airway Management Planned: LMA and Oral ETT  Additional Equipment:   Intra-op Plan:   Post-operative Plan: Extubation in OR  Informed Consent: I have reviewed the patients History and Physical, chart, labs and discussed the procedure including the risks, benefits and alternatives for the proposed anesthesia with the patient or authorized representative who has indicated his/her understanding and acceptance.     Dental advisory given  Plan Discussed with: CRNA  Anesthesia Plan Comments:         Anesthesia Quick Evaluation

## 2018-08-26 ENCOUNTER — Encounter (HOSPITAL_BASED_OUTPATIENT_CLINIC_OR_DEPARTMENT_OTHER): Payer: Self-pay | Admitting: Orthopedic Surgery

## 2019-04-26 ENCOUNTER — Emergency Department (HOSPITAL_COMMUNITY): Payer: Managed Care, Other (non HMO)

## 2019-04-26 ENCOUNTER — Other Ambulatory Visit: Payer: Self-pay

## 2019-04-26 ENCOUNTER — Emergency Department (HOSPITAL_COMMUNITY)
Admission: EM | Admit: 2019-04-26 | Discharge: 2019-04-26 | Disposition: A | Discharge status: Home / Self Care | Payer: Managed Care, Other (non HMO) | Attending: Emergency Medicine | Admitting: Emergency Medicine

## 2019-04-26 ENCOUNTER — Encounter (HOSPITAL_COMMUNITY): Payer: Self-pay

## 2019-04-26 DIAGNOSIS — M1612 Unilateral primary osteoarthritis, left hip: Secondary | ICD-10-CM | POA: Diagnosis not present

## 2019-04-26 DIAGNOSIS — Z7982 Long term (current) use of aspirin: Secondary | ICD-10-CM | POA: Insufficient documentation

## 2019-04-26 DIAGNOSIS — M25552 Pain in left hip: Secondary | ICD-10-CM | POA: Insufficient documentation

## 2019-04-26 DIAGNOSIS — F17210 Nicotine dependence, cigarettes, uncomplicated: Secondary | ICD-10-CM | POA: Insufficient documentation

## 2019-04-26 DIAGNOSIS — I10 Essential (primary) hypertension: Secondary | ICD-10-CM | POA: Insufficient documentation

## 2019-04-26 LAB — URINALYSIS, ROUTINE W REFLEX MICROSCOPIC
Bacteria, UA: NONE SEEN
Glucose, UA: NEGATIVE mg/dL
Hgb urine dipstick: NEGATIVE
Ketones, ur: 5 mg/dL — AB
Leukocytes,Ua: NEGATIVE
Nitrite: NEGATIVE
Protein, ur: 30 mg/dL — AB
Specific Gravity, Urine: 1.036 — ABNORMAL HIGH (ref 1.005–1.030)
pH: 5 (ref 5.0–8.0)

## 2019-04-26 MED ORDER — HYDROCODONE-ACETAMINOPHEN 5-325 MG PO TABS: 1.0000 | ORAL_TABLET | ORAL | 0 refills | Status: AC | PRN

## 2019-04-26 MED ORDER — KETOROLAC TROMETHAMINE 30 MG/ML IJ SOLN
30.0000 mg | Freq: Once | INTRAMUSCULAR | Status: AC
  Administered 2019-04-26: 30 mg via INTRAVENOUS
  Filled 2019-04-26: qty 1

## 2019-04-26 MED ORDER — MORPHINE SULFATE (PF) 4 MG/ML IV SOLN
4.0000 mg | Freq: Once | INTRAVENOUS | Status: AC
  Administered 2019-04-26: 07:00:00 4 mg via INTRAVENOUS
  Filled 2019-04-26: qty 1

## 2019-04-26 MED ORDER — ONDANSETRON HCL 4 MG/2ML IJ SOLN
4.0000 mg | Freq: Once | INTRAMUSCULAR | Status: AC
  Administered 2019-04-26: 4 mg via INTRAVENOUS
  Filled 2019-04-26: qty 2

## 2019-04-26 MED ORDER — MORPHINE SULFATE (PF) 4 MG/ML IV SOLN
4.0000 mg | Freq: Once | INTRAVENOUS | Status: AC
  Administered 2019-04-26: 4 mg via INTRAVENOUS
  Filled 2019-04-26: qty 1

## 2019-04-26 MED ORDER — IBUPROFEN 800 MG PO TABS: 800.0000 mg | ORAL_TABLET | Freq: Four times a day (QID) | ORAL | 0 refills | Status: AC | PRN

## 2019-04-26 NOTE — ED Notes (Signed)
Pt transported to Xray. 

## 2019-04-26 NOTE — ED Notes (Signed)
Assisted pt with standing to provide urine sample. Pt was unable to urinate. Will try again later.

## 2019-04-26 NOTE — ED Notes (Signed)
Pt reminded of need for UA and reminded of call bell use

## 2019-04-26 NOTE — ED Triage Notes (Signed)
Pt states starting 04/25/19 at 1830 he experienced pain in his left hip. Pt states the pain does not radiate anywhere, however, the pain has made it difficult to walk and is painful when trying to move leg.   Pain denies COVID contact, SHOB, fever or cough.

## 2019-04-26 NOTE — ED Notes (Signed)
Pt aware of the need of a urine sample. Urinal placed at pts bedside.

## 2019-04-26 NOTE — ED Notes (Signed)
Pt provided water. Upon entering room, pt was attempting to void and had on waterfall sounds.

## 2019-04-26 NOTE — Discharge Instructions (Addendum)
You were evaluated in the Emergency Department and after careful evaluation, we did not find any emergent condition requiring admission or further testing in the hospital.  Your exam/testing today was overall reassuring.  Your symptoms seem to be due to arthritis of the hip.  You can use the medications provided as needed for pain.  Recommend resting the hip for the next 3 to 5 days with slow return to normal daily activities.  If your pain continues, we recommend follow-up with the orthopedic specialist.  Please return to the Emergency Department if you experience any worsening of your condition.  We encourage you to follow up with a primary care provider.  Thank you for allowing [place] to be a part of your care.

## 2019-04-26 NOTE — ED Notes (Signed)
Dr Sedonia Small encouraged pt to ambulate. Pt ambulated slowly, with difficulty to restroom.

## 2019-04-26 NOTE — ED Provider Notes (Signed)
[facility] DEPT Provider Note   CSN: [id number] Arrival date & time: 04/26/19  0522     History   Chief Complaint Chief Complaint  Patient presents with  . Hip Pain    HPI Martin Freeman is a 58 y.o. male.     Patient presents to the emergency department for evaluation of left hip and groin pain.  Symptoms began yesterday around 6:30 PM.  Patient reports that he stood up from a seated position and felt the pain.  Pain has been present ever since.  Pain worsens with various changes in position and movements of the left leg and hip area.  He denies any fall or direct injury.  He has not had any abdominal pain.  He denies urinary symptoms.  There is no pain in the back.  Pain does not radiate down the leg.     Past Medical History:  Diagnosis Date  . Calcaneal fracture    left  . Hypertension    states he stopped BP med on his own    There are no active problems to display for this patient.   Past Surgical History:  Procedure Laterality Date  . CYST EXCISION PERINEAL     groin  . EYE SURGERY    . ORIF CALCANEOUS FRACTURE Left 08/25/2018   Procedure: OPEN REDUCTION INTERNAL FIXATION (ORIF) Left calcaneus fracture;  Surgeon: Wylene Simmer, MD;  Location: [facility];  Service: Orthopedics;  Laterality: Left;  153min  . TONSILLECTOMY          Home Medications    Prior to Admission medications   Medication Sig Start Date End Date Taking? Authorizing Provider  aspirin EC 81 MG tablet Take 1 tablet (81 mg total) by mouth 2 (two) times daily. 08/25/18   Wylene Simmer, MD    Family History No family history on file.  Social History Social History   Tobacco Use  . Smoking status: Current Every Day Smoker    Packs/day: 1.00  . Smokeless tobacco: Never Used  Substance Use Topics  . Alcohol use: Not Currently  . Drug use: Never     Allergies   Patient has no known allergies.   Review of Systems Review of  Systems  Musculoskeletal: Positive for arthralgias.  All other systems reviewed and are negative.    Physical Exam Updated Vital Signs BP (!) 178/94   Pulse 75   Temp 98.6 F (37 C) (Oral)   Resp 18   SpO2 95%   Physical Exam Vitals signs and nursing note reviewed.  Constitutional:      General: He is not in acute distress.    Appearance: Normal appearance. He is well-developed.  HENT:     Head: Normocephalic and atraumatic.     Right Ear: Hearing normal.     Left Ear: Hearing normal.     Nose: Nose normal.  Eyes:     Conjunctiva/sclera: Conjunctivae normal.     Pupils: Pupils are equal, round, and reactive to light.  Neck:     Musculoskeletal: Normal range of motion and neck supple.  Cardiovascular:     Rate and Rhythm: Regular rhythm.     Heart sounds: S1 normal and S2 normal. No murmur. No friction rub. No gallop.   Pulmonary:     Effort: Pulmonary effort is normal. No respiratory distress.     Breath sounds: Normal breath sounds.  Chest:     Chest wall: No tenderness.  Abdominal:  General: Bowel sounds are normal.     Palpations: Abdomen is soft.     Tenderness: There is no abdominal tenderness. There is no guarding or rebound. Negative signs include Murphy's sign and McBurney's sign.     Hernia: No hernia is present.  Musculoskeletal:     Left hip: He exhibits decreased range of motion (pain) and tenderness.  Skin:    General: Skin is warm and dry.     Findings: No rash.  Neurological:     Mental Status: He is alert and oriented to person, place, and time.     GCS: GCS eye subscore is 4. GCS verbal subscore is 5. GCS motor subscore is 6.     Cranial Nerves: No cranial nerve deficit.     Sensory: No sensory deficit.     Coordination: Coordination normal.  Psychiatric:        Speech: Speech normal.        Behavior: Behavior normal.        Thought Content: Thought content normal.      ED Treatments / Results  Labs (all labs ordered are listed, but  only abnormal results are displayed) Labs Reviewed  URINALYSIS, ROUTINE W REFLEX MICROSCOPIC    EKG None  Radiology Dg Hip Unilat W Or Wo Pelvis 2-3 Views Left  Result Date: 04/26/2019 CLINICAL DATA:  Left grinding hip pain EXAM: DG HIP (WITH OR WITHOUT PELVIS) 2-3V LEFT COMPARISON:  None. FINDINGS: There is no acute displaced fracture. No dislocation. There is mild-to-moderate osteoarthritis involving both hips. IMPRESSION: 1. No acute displaced fracture or dislocation. 2. Mild to moderate osteoarthritis of both hips. Electronically Signed   By: Katherine Mantle M.D.   On: 04/26/2019 06:21    Procedures Procedures (including critical care time)  Medications Ordered in ED Medications  morphine 4 MG/ML injection 4 mg (4 mg Intravenous Given 04/26/19 0619)  ondansetron (ZOFRAN) injection 4 mg (4 mg Intravenous Given 04/26/19 0617)  ketorolac (TORADOL) 30 MG/ML injection 30 mg (30 mg Intravenous Given 04/26/19 0618)     Initial Impression / Assessment and Plan / ED Course  I have reviewed the triage vital signs and the nursing notes.  Pertinent labs & imaging results that were available during my care of the patient were reviewed by me and considered in my medical decision making (see chart for details).        Patient presents to the emergency department for evaluation of left groin and hip pain.  Symptoms began yesterday.  Patient describes a sharp pain in the inguinal area and throughout the hip that worsens with movement.  When he tries to turn to the side or change positions from sitting to standing the pain worsens.  He denies any injury.  X-ray reveals osteoarthritis but no acute injury.  He has normal strength and sensation.  Normal distal pulses.  Pain does not radiate.  No back pain or tenderness on exam.  There is no redness, erythema, warmth or swelling around the hip region.  Patient treated with Toradol and analgesia with improvement.  Patient will be discharged, continue  analgesia and follow-up with primary care, also given Ortho referral.  Final Clinical Impressions(s) / ED Diagnoses   Final diagnoses:  Left hip pain  Osteoarthritis of left hip, unspecified osteoarthritis type    ED Discharge Orders    None       Gilda Crease, MD 04/26/19 [phone]

## 2019-04-26 NOTE — ED Notes (Signed)
Pt attempting to give urine specimen at this time.

## 2019-04-26 NOTE — ED Provider Notes (Signed)
  Provider Note MRN:  [id number]  Arrival date & time: 04/26/19    ED Course and Medical Decision Making  Assumed care from Dr. Waverly Ferrari at shift change.  Hip pain favored to be musculoskeletal in etiology, awaiting urinalysis, pain control, anticipating discharge.  8:45 AM update: Continued significant pain, ambulating but with great difficulty.  Will CT to exclude signs of infectious process such as septic joint.  11 AM update: CT is largely unremarkable, no effusion and therefore very low possibility of septic joint.  Strict return precautions for fever, otherwise appropriate for discharge with Ortho follow-up.  Final Clinical Impressions(s) / ED Diagnoses     ICD-10-CM   1. Left hip pain  M25.552   2. Osteoarthritis of left hip, unspecified osteoarthritis type  M16.12     ED Discharge Orders         Ordered    ibuprofen (ADVIL) 800 MG tablet  Every 6 hours PRN     04/26/19 0639    HYDROcodone-acetaminophen (NORCO/VICODIN) 5-325 MG tablet  Every 4 hours PRN     04/26/19 0639          Discharge Instructions   None     Barth Kirks. Sedonia Small, [place] [email or web address]    Maudie Flakes, MD 04/26/19 [phone]

## 2021-03-09 ENCOUNTER — Emergency Department (HOSPITAL_COMMUNITY)
Admission: EM | Admit: 2021-03-09 | Discharge: 2021-03-10 | Disposition: A | Discharge status: Home / Self Care | Payer: Self-pay | Attending: Emergency Medicine | Admitting: Emergency Medicine

## 2021-03-09 ENCOUNTER — Emergency Department (HOSPITAL_COMMUNITY): Payer: Self-pay

## 2021-03-09 ENCOUNTER — Other Ambulatory Visit: Payer: Self-pay

## 2021-03-09 DIAGNOSIS — F17210 Nicotine dependence, cigarettes, uncomplicated: Secondary | ICD-10-CM | POA: Insufficient documentation

## 2021-03-09 DIAGNOSIS — I10 Essential (primary) hypertension: Secondary | ICD-10-CM | POA: Insufficient documentation

## 2021-03-09 DIAGNOSIS — R61 Generalized hyperhidrosis: Secondary | ICD-10-CM | POA: Insufficient documentation

## 2021-03-09 DIAGNOSIS — W1839XA Other fall on same level, initial encounter: Secondary | ICD-10-CM | POA: Insufficient documentation

## 2021-03-09 DIAGNOSIS — R42 Dizziness and giddiness: Secondary | ICD-10-CM | POA: Insufficient documentation

## 2021-03-09 DIAGNOSIS — R55 Syncope and collapse: Secondary | ICD-10-CM | POA: Insufficient documentation

## 2021-03-09 DIAGNOSIS — Z7982 Long term (current) use of aspirin: Secondary | ICD-10-CM | POA: Insufficient documentation

## 2021-03-09 LAB — COMPREHENSIVE METABOLIC PANEL
ALT: 13 U/L (ref 0–44)
AST: 16 U/L (ref 15–41)
Albumin: 3.6 g/dL (ref 3.5–5.0)
Alkaline Phosphatase: 70 U/L (ref 38–126)
Anion gap: 5 (ref 5–15)
BUN: 13 mg/dL (ref 8–23)
CO2: 28 mmol/L (ref 22–32)
Calcium: 8.6 mg/dL — ABNORMAL LOW (ref 8.9–10.3)
Chloride: 108 mmol/L (ref 98–111)
Creatinine, Ser: 1.51 mg/dL — ABNORMAL HIGH (ref 0.61–1.24)
GFR, Estimated: 52 mL/min — ABNORMAL LOW (ref >60)
Glucose, Bld: 128 mg/dL — ABNORMAL HIGH (ref 70–99)
Potassium: 4.2 mmol/L (ref 3.5–5.1)
Sodium: 141 mmol/L (ref 135–145)
Total Bilirubin: 0.8 mg/dL (ref 0.3–1.2)
Total Protein: 6.3 g/dL — ABNORMAL LOW (ref 6.5–8.1)

## 2021-03-09 LAB — CBC WITH DIFFERENTIAL/PLATELET
Abs Immature Granulocytes: 0.01 10*3/uL (ref 0.00–0.07)
Basophils Absolute: 0 10*3/uL (ref 0.0–0.1)
Basophils Relative: 0 %
Eosinophils Absolute: 0 10*3/uL (ref 0.0–0.5)
Eosinophils Relative: 1 %
HCT: 42 % (ref 39.0–52.0)
Hemoglobin: 13.8 g/dL (ref 13.0–17.0)
Immature Granulocytes: 0 %
Lymphocytes Relative: 17 %
Lymphs Abs: 1 10*3/uL (ref 0.7–4.0)
MCH: 32.1 pg (ref 26.0–34.0)
MCHC: 32.9 g/dL (ref 30.0–36.0)
MCV: 97.7 fL (ref 80.0–100.0)
Monocytes Absolute: 0.5 10*3/uL (ref 0.1–1.0)
Monocytes Relative: 9 %
Neutro Abs: 4.3 10*3/uL (ref 1.7–7.7)
Neutrophils Relative %: 73 %
Platelets: 189 10*3/uL (ref 150–400)
RBC: 4.3 MIL/uL (ref 4.22–5.81)
RDW: 14 % (ref 11.5–15.5)
WBC: 5.9 10*3/uL (ref 4.0–10.5)
nRBC: 0 % (ref 0.0–0.2)

## 2021-03-09 LAB — TROPONIN I (HIGH SENSITIVITY): Troponin I (High Sensitivity): 5 ng/L (ref <18)
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
## 2021-03-09 NOTE — ED Provider Notes (Signed)
Patient care assumed at 2330.  Pt here following syncopal event. Has a hx/o tobacco use, HTN - not currently on meds.  Low index of suspicion for Cardiac source.  Repeat EKG and trop pending.    Repeat EKG is stable compared to prior. Repeat troponin is negative. On repeat evaluation patient is asymptomatic. He does remain hypertensive in the emergency department. Will start on amlodipine until he is able to follow-up with his PCP. Return precautions discussed.   Tilden Fossa, MD 03/10/21 Emeline Darling

## 2021-03-09 NOTE — ED Provider Notes (Signed)
[facility] EMERGENCY DEPARTMENT Provider Note   CSN: [id number] Arrival date & time: 03/09/21  1901     History No chief complaint on file.   Martin Freeman is a 60 y.o. male.  Patient is a 60 year old male with a history of hypertension and glaucoma who is presenting today after a syncopal event.  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  He went outside and smoked a cigarette and then when he tried to walk inside he started feeling very hot and lightheaded and sweaty.  He fell to the ground and lost consciousness for short period of time and reports he feels fine now.  He denies any history of chest pain, palpitations, shortness of breath.  No abdominal pain nausea or vomiting.  No prior history of syncope.  He denies any alcohol use.  He only smokes cigarettes and does not use any type of drugs or marijuana.  He is taking eyedrops and recently started an antibiotic but no other medications.  He had been on antihypertensives for approximately 1 year but did not like the way they made him feel and he stopped taking them.  He has been off them for multiple years.  He denies any history of heart problems but his father did die of a dissecting aneurysm.  No recent surgeries, prolonged immobilization or unilateral leg pain or swelling.  He has been eating and drinking normally denies any reason he would be dehydrated.  The history is provided by the patient.      Past Medical History:  Diagnosis Date   Calcaneal fracture    left   Hypertension    states he stopped BP med on his own    There are no problems to display for this patient.   Past Surgical History:  Procedure Laterality Date   CYST EXCISION PERINEAL     groin   EYE SURGERY     ORIF CALCANEOUS FRACTURE Left 08/25/2018   Procedure: OPEN REDUCTION INTERNAL FIXATION (ORIF) Left calcaneus fracture;  Surgeon: Wylene Simmer, MD;   Location: [facility];  Service: Orthopedics;  Laterality: Left;  123mn   TONSILLECTOMY         No family history on file.  Social History   Tobacco Use   Smoking status: Every Day    Packs/day: 1.00    Types: Cigarettes   Smokeless tobacco: Never  Substance Use Topics   Alcohol use: Not Currently   Drug use: Never    Home Medications Prior to Admission medications   Medication Sig Start Date End Date Taking? Authorizing Provider  acetaminophen (TYLENOL) 500 MG tablet Take 1,000 mg by mouth every 6 (six) hours as needed for mild pain or headache.    [provider]  aspirin EC 81 MG tablet Take 1 tablet (81 mg total) by mouth 2 (two) times daily. Patient not taking: Reported on 04/26/2019 08/25/18   HWylene Simmer MD  HYDROcodone-acetaminophen (NORCO/VICODIN) 5-325 MG tablet Take 1 tablet by mouth every 4 (four) hours as needed for moderate pain. 04/26/19   POrpah Greek MD  ibuprofen (ADVIL) 800 MG tablet Take 1 tablet (800 mg total) by mouth every 6 (six) hours as needed for moderate pain. 04/26/19   POrpah Greek MD  Menthol-Methyl Salicylate (MUSCLE RUB) 10-15 % CREA Apply 1 application topically as needed for muscle pain (left leg).  [provider]    Allergies    Patient has no known allergies.  Review of Systems   Review of Systems  All other systems reviewed and are negative.  Physical Exam Updated Vital Signs There were no vitals taken for this visit.  Physical Exam Vitals and nursing note reviewed.  Constitutional:      General: He is not in acute distress.    Appearance: Normal appearance. He is well-developed.  HENT:     Head: Normocephalic and atraumatic.     Nose: Nose normal.     Mouth/Throat:     Mouth: Mucous membranes are moist.  Eyes:     Conjunctiva/sclera: Conjunctivae normal.     Pupils: Pupils are equal, round, and reactive to light.  Cardiovascular:     Rate and Rhythm: Normal rate and  regular rhythm.     Heart sounds: No murmur heard. Pulmonary:     Effort: Pulmonary effort is normal. No respiratory distress.     Breath sounds: Normal breath sounds. No wheezing or rales.  Abdominal:     General: There is no distension.     Palpations: Abdomen is soft.     Tenderness: There is no abdominal tenderness. There is no guarding or rebound.  Musculoskeletal:        General: No tenderness. Normal range of motion.     Cervical back: Normal range of motion and neck supple.     Right lower leg: No edema.     Left lower leg: No edema.  Skin:    General: Skin is warm and dry.     Findings: No erythema or rash.  Neurological:     General: No focal deficit present.     Mental Status: He is alert and oriented to person, place, and time. Mental status is at baseline.     Sensory: No sensory deficit.     Motor: No weakness.  Psychiatric:        Mood and Affect: Mood normal.        Behavior: Behavior normal.        Thought Content: Thought content normal.    ED Results / Procedures / Treatments   Labs (all labs ordered are listed, but only abnormal results are displayed) Labs Reviewed  CBC WITH DIFFERENTIAL/PLATELET  COMPREHENSIVE METABOLIC PANEL  TROPONIN I (HIGH SENSITIVITY)    EKG EKG Interpretation  Date/Time:  Saturday March 09 2021 19:22:52 EDT Ventricular Rate:  79 PR Interval:  168 QRS Duration: 76 QT Interval:  357 QTC Calculation: 410 R Axis:   39 Text Interpretation: Sinus rhythm Probable anteroseptal infarct, recent Lateral leads are also involved , new T wave inversion Inferior leads Confirmed by Blanchie Dessert [id number]) on 03/09/2021 7:27:10 PM  Radiology No results found.  Procedures Procedures   Medications Ordered in ED Medications - No data to display  ED Course  I have reviewed the triage vital signs and the nursing notes.  Pertinent labs & imaging results that were available during my care of the patient were reviewed by me and  considered in my medical decision making (see chart for details).    MDM Rules/Calculators/A&P                           Patient is a 60 year old male presenting today after a syncopal event.  Patient did have prodromal symptoms of feeling lightheaded and very sweaty prior to and after the event.  He denied any  chest pain or shortness of breath throughout the episode but has never had episodes of syncope before.  Patient noted to have significant ST elevation in V2 and mild elevation in V3 with T wave inversion inferiorly and laterally.  Based on EKG from 2019 patient did have mild ST elevation at that time.  Cardiology Dr. Juliane Lack was present and evaluated the EKG but did not think it met STEMI criteria and pt denies any chest pain at this time.  Will get labs and repeat EKG. Initial labs without acute findings.  Will get delta trop and repeat EKG.  Pt still feeling well.  Pt checked out to Dr. Ralene Bathe  MDM   Amount and/or Complexity of Data Reviewed Clinical lab tests: ordered and reviewed Tests in the radiology section of CPT: ordered and reviewed Tests in the medicine section of CPT: ordered and reviewed Independent visualization of images, tracings, or specimens: yes    Final Clinical Impression(s) / ED Diagnoses Final diagnoses:  None    Rx / DC Orders ED Discharge Orders     None        Blanchie Dessert, MD 03/12/21 1250

## 2021-03-10 LAB — TROPONIN I (HIGH SENSITIVITY): Troponin I (High Sensitivity): 6 ng/L (ref <18)

## 2021-03-10 MED ORDER — AMLODIPINE BESYLATE 5 MG PO TABS: 5.0000 mg | ORAL_TABLET | Freq: Every day | ORAL | 0 refills | Status: DC

## 2021-03-10 NOTE — ED Notes (Signed)
Pt ambulated in room without difficulty  

## 2021-04-30 IMAGING — CT CT HIP*L* W/O CM
2 of 3 series · 17 of 46 positions shown, 19 images · non-contrast
Comparison: Radiographs dated 04/26/2019

CLINICAL DATA: Left hip pain.

EXAM:
CT OF THE LEFT HIP WITHOUT CONTRAST
TECHNIQUE: Multidetector CT imaging of the left hip was performed according to
the standard protocol. Multiplanar CT image reconstructions were
also generated.

[Series 3: axial (person_name) · axial · 0.51mm/px · z∈[-218,-48]mm · 14 of 99 slices shown, 16 images]
[im 7/99  soft-tissue]
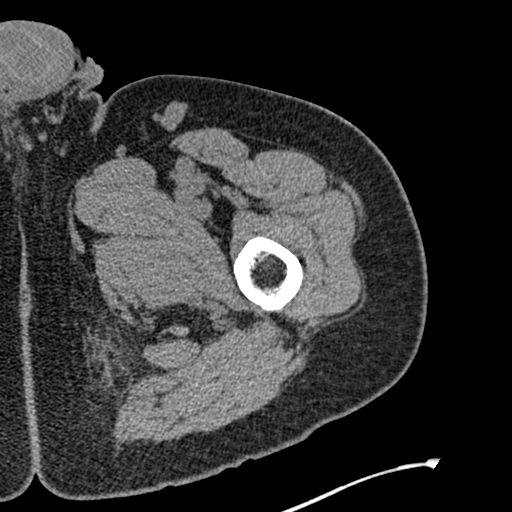
[im 7/99  bone]
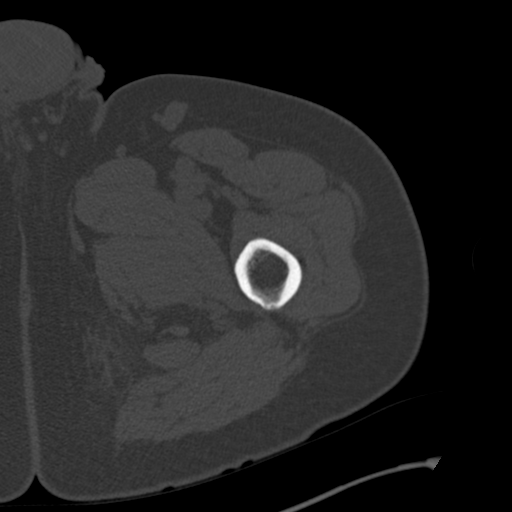
[im 13/99  soft-tissue]
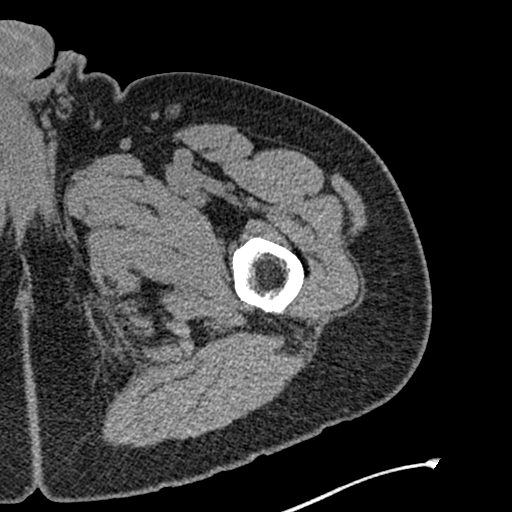
[im 19/99  soft-tissue]
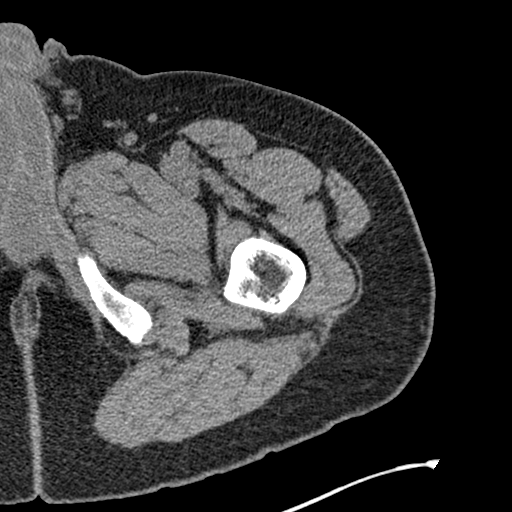
[im 26/99  soft-tissue]
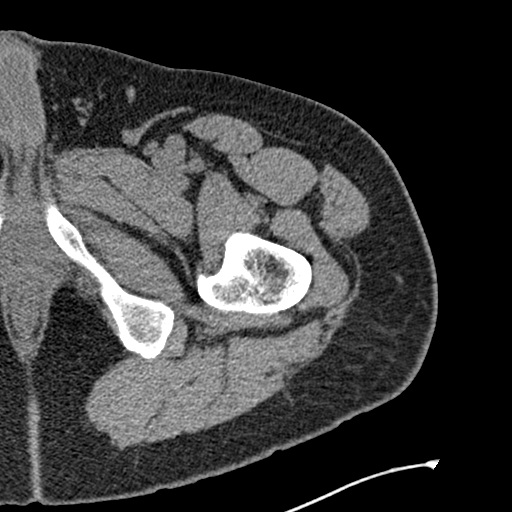
[im 32/99  soft-tissue]
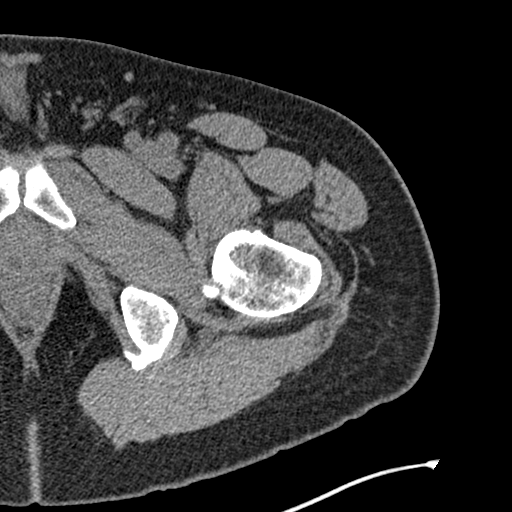
[im 38/99  soft-tissue]
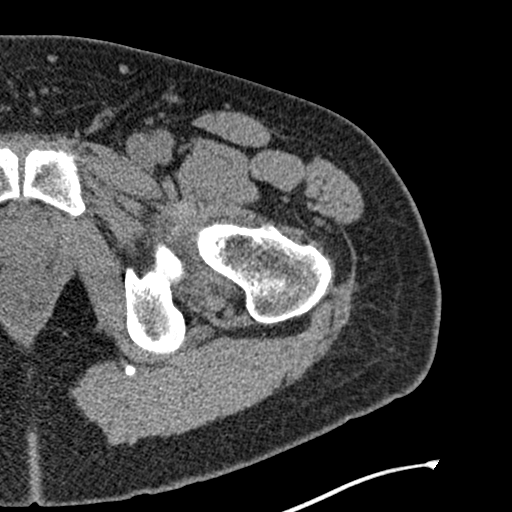
[im 45/99  soft-tissue]
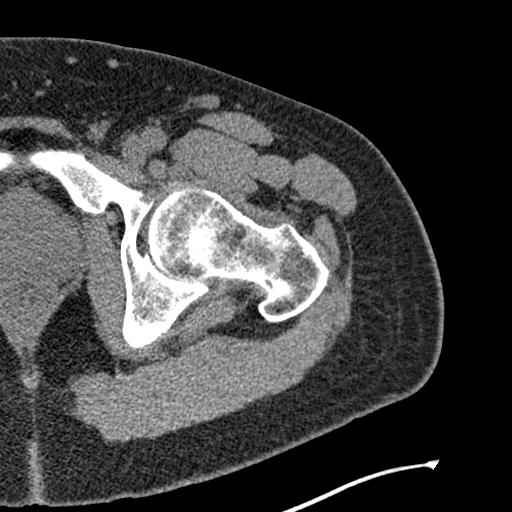
[im 54/99  soft-tissue]
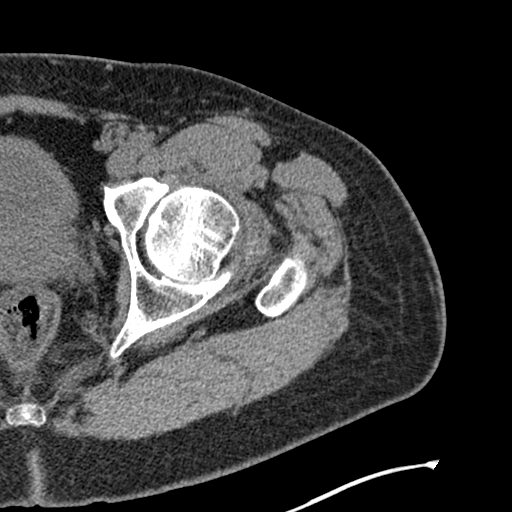
[im 61/99  soft-tissue]
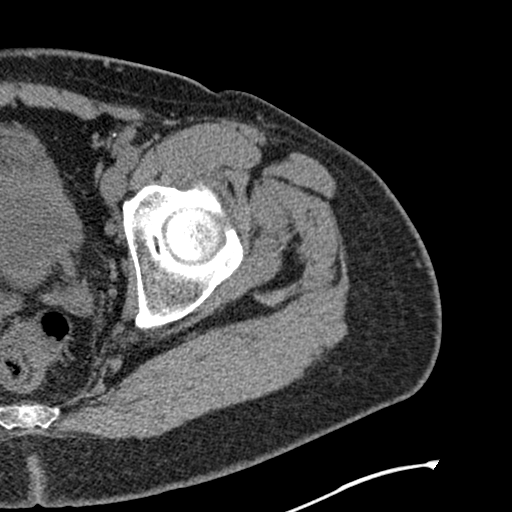
[im 61/99  bone]
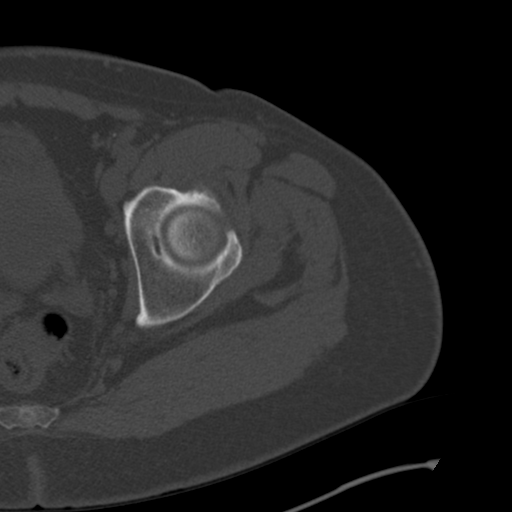
[im 67/99  soft-tissue]
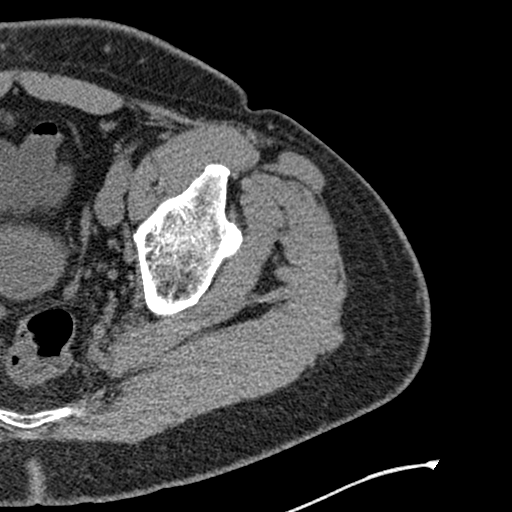
[im 73/99  soft-tissue]
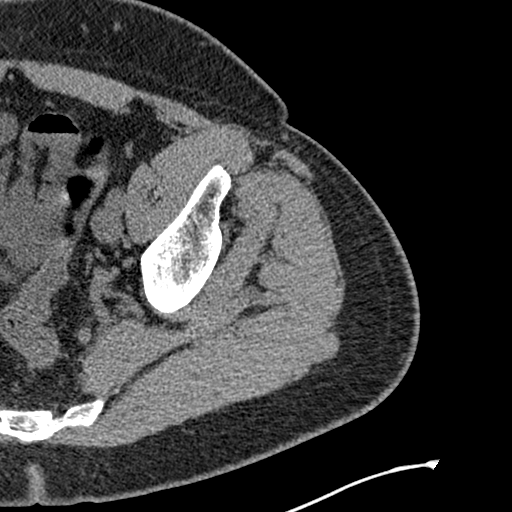
[im 80/99  soft-tissue]
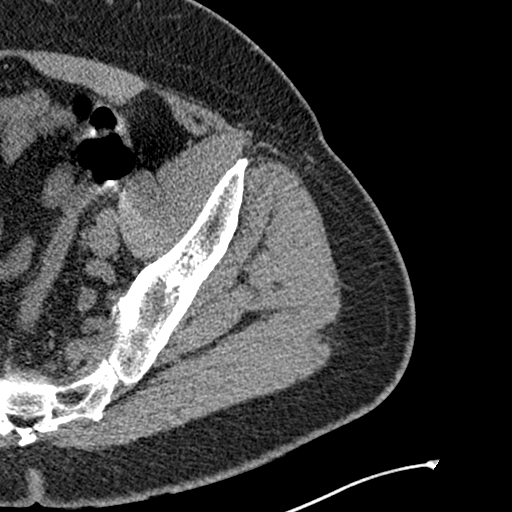
[im 86/99  soft-tissue]
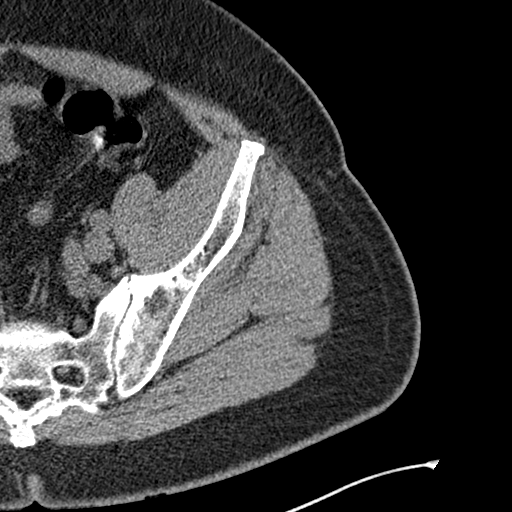
[im 92/99  soft-tissue]
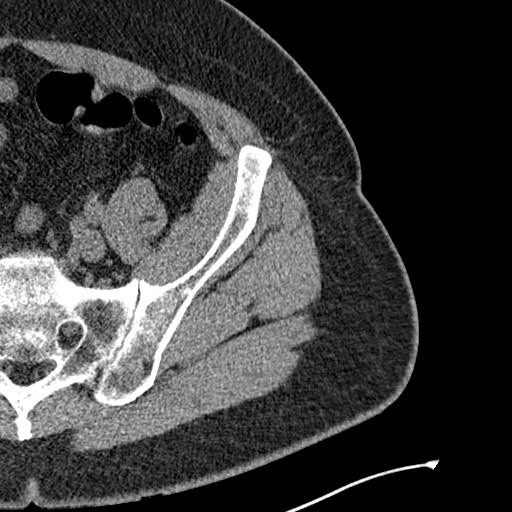

[Series 8: coronal st · coronal · 0.38mm/px · 3 of 118 slices shown]
[im 40/118  soft-tissue]
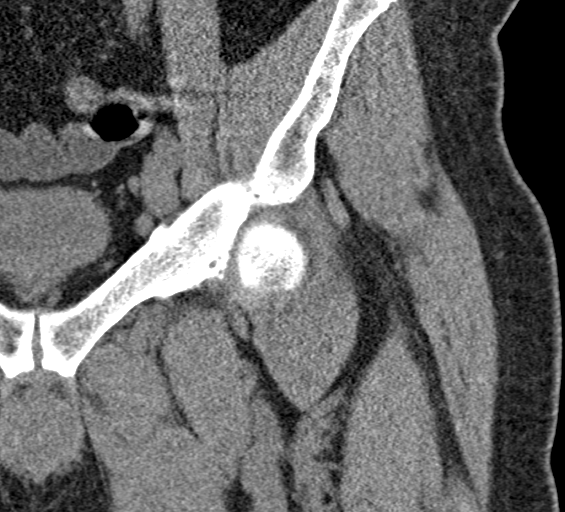
[im 53/118  soft-tissue]
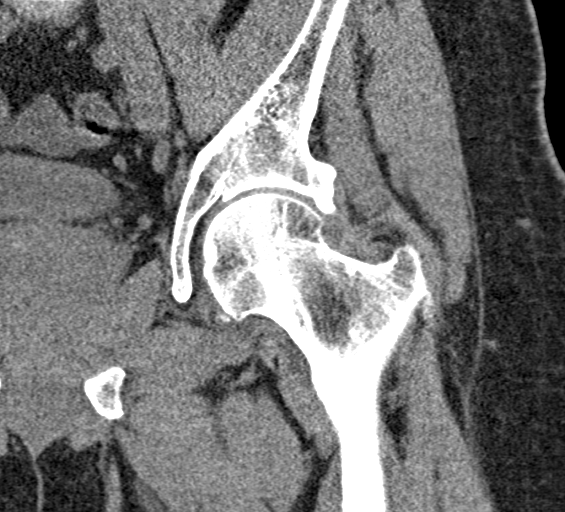
[im 66/118  soft-tissue]
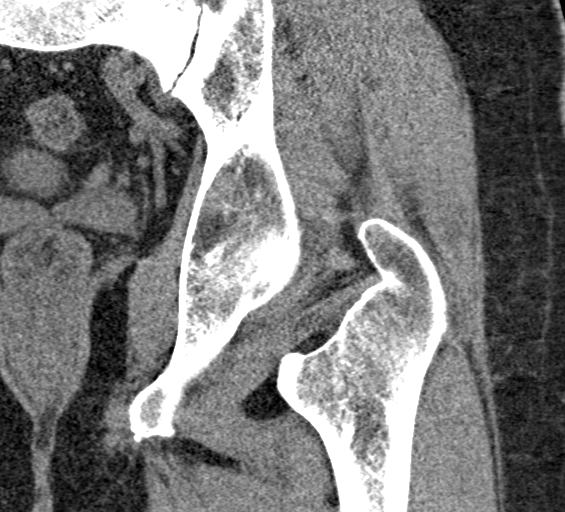

[17 of 46 positions shown; findings below may reference images not displayed]

FINDINGS: Bones/Joint/Cartilage

There are minimal arthritic changes of the left hip with a tiny
marginal osteophyte on the inferior medial aspect of the humeral
head and minimal cystic degenerative changes of the anterior aspect
of the acetabulum. There is thinning of the articular cartilage of
the posterior aspect of the hip joint. No appreciable hip joint
effusion.

There is moderate arthritis of the left sacroiliac joint.

Muscles and Tendons

Normal.

Soft tissues

No adenopathy or mass lesions.
IMPRESSION: 1. Minimal arthritic changes of the left hip with thinning of the
articular cartilage of the posterior aspect of the hip joint. No
appreciable joint effusion.
2. Moderate arthritis of the left sacroiliac joint.

## 2021-09-04 ENCOUNTER — Emergency Department (HOSPITAL_COMMUNITY)
Admission: EM | Admit: 2021-09-04 | Discharge: 2021-09-04 | Disposition: A | Discharge status: Home / Self Care | Payer: BC Managed Care – PPO | Attending: Emergency Medicine | Admitting: Emergency Medicine

## 2021-09-04 ENCOUNTER — Other Ambulatory Visit: Payer: Self-pay

## 2021-09-04 ENCOUNTER — Encounter (HOSPITAL_COMMUNITY): Payer: Self-pay

## 2021-09-04 DIAGNOSIS — Z79899 Other long term (current) drug therapy: Secondary | ICD-10-CM | POA: Insufficient documentation

## 2021-09-04 DIAGNOSIS — B0052 Herpesviral keratitis: Secondary | ICD-10-CM

## 2021-09-04 DIAGNOSIS — R110 Nausea: Secondary | ICD-10-CM | POA: Insufficient documentation

## 2021-09-04 DIAGNOSIS — R600 Localized edema: Secondary | ICD-10-CM | POA: Insufficient documentation

## 2021-09-04 DIAGNOSIS — R519 Headache, unspecified: Secondary | ICD-10-CM | POA: Insufficient documentation

## 2021-09-04 DIAGNOSIS — H5712 Ocular pain, left eye: Secondary | ICD-10-CM | POA: Diagnosis present

## 2021-09-04 DIAGNOSIS — H538 Other visual disturbances: Secondary | ICD-10-CM | POA: Diagnosis not present

## 2021-09-04 MED ORDER — TETRACAINE HCL 0.5 % OP SOLN
2.0000 [drp] | Freq: Once | OPHTHALMIC | Status: AC
  Administered 2021-09-04: 2 [drp] via OPHTHALMIC
  Filled 2021-09-04: qty 4

## 2021-09-04 MED ORDER — FLUORESCEIN SODIUM 1 MG OP STRP
ORAL_STRIP | OPHTHALMIC | Status: AC
  Filled 2021-09-04: qty 1

## 2021-09-04 MED ORDER — OXYCODONE-ACETAMINOPHEN 5-325 MG PO TABS
2.0000 | ORAL_TABLET | Freq: Once | ORAL | Status: AC
  Administered 2021-09-04: 2 via ORAL
  Filled 2021-09-04: qty 2

## 2021-09-04 NOTE — ED Provider Notes (Signed)
?[facility]-EMERGENCY DEPT ?Provider Note ? ? ?CSN: [id number] ?Arrival date & time: 09/04/21  0749 ? ?  ? ?History ? ?Chief Complaint  ?Patient presents with  ? Facial Swelling  ? ? ?Martin Freeman is a 61 y.o. male. ? ?61 year old male with history of glaucoma in the left eye presents with 3 days of left eye pain.  Patient states he had photophobia.  Denies any trauma.  Has had a left-sided headache with this.  Nausea without emesis.  Decreased vision as well 2.  Has a history of acute narrow angle closure glaucoma and is a trabeculectomy in the past.  States that he sees a glaucoma specialist in [place].  Patient states that he hopes that his home glaucoma medications will help and a half. ? ? ?  ? ?Home Medications ?Prior to Admission medications   ?Medication Sig Start Date End Date Taking? Authorizing Provider  ?acetaminophen (TYLENOL) 500 MG tablet Take 1,000 mg by mouth every 6 (six) hours as needed for mild pain or headache.    [provider]  ?amLODipine (NORVASC) 5 MG tablet Take 1 tablet (5 mg total) by mouth daily. 03/10/21   Tilden Fossa, MD  ?aspirin EC 81 MG tablet Take 1 tablet (81 mg total) by mouth 2 (two) times daily. ?Patient not taking: No sig reported 08/25/18   Toni Arthurs, MD  ?HYDROcodone-acetaminophen (NORCO/VICODIN) 5-325 MG tablet Take 1 tablet by mouth every 4 (four) hours as needed for moderate pain. ?Patient not taking: No sig reported 04/26/19   Gilda Crease, MD  ?ibuprofen (ADVIL) 800 MG tablet Take 1 tablet (800 mg total) by mouth every 6 (six) hours as needed for moderate pain. ?Patient not taking: No sig reported 04/26/19   Gilda Crease, MD  ?Multiple Vitamins-Minerals (MULTI-VITAMIN GUMMIES PO) Take 4 tablets by mouth daily.    [provider]  ?prednisoLONE acetate (PRED FORTE) 1 % ophthalmic suspension Place 1 drop into the left eye in the morning and at bedtime.    [provider]  ?   ? ?Allergies     ?Patient has no known allergies.   ? ?Review of Systems   ?Review of Systems  ?All other systems reviewed and are negative. ? ?Physical Exam ?Updated Vital Signs ?BP 136/69 (BP Location: Left Arm)   Pulse 70   Temp 98.1 ?F (36.7 ?C)   Resp 18   Ht 1.905 m (6\' 3" )   Wt 113.4 kg   SpO2 100%   BMI 31.25 kg/m?  ?Physical Exam ?Vitals and nursing note reviewed.  ?Constitutional:   ?   General: He is not in acute distress. ?   Appearance: Normal appearance. He is well-developed. He is not toxic-appearing.  ?HENT:  ?   Head: Normocephalic and atraumatic.  ?Eyes:  ?   General: Lids are normal.  ?   Intraocular pressure: Left eye pressure is 22 mmHg. Measurements were taken using a handheld tonometer. ?   Conjunctiva/sclera:  ?   Left eye: Left conjunctiva is injected.  ?   Pupils: Pupils are equal, round, and reactive to light.  ?   Left eye: Fluorescein uptake present.  ?   Funduscopic exam:    ?   Left eye: No exudate.  ?   Comments: Cornea cloudy, left upper lid edema.  Possible dendritic-like lesion noted at 6:00 on patient's cornea.  ?Neck:  ?   Thyroid: No thyroid mass.  ?   Trachea: No tracheal deviation.  ?Cardiovascular:  ?  Rate and Rhythm: Normal rate and regular rhythm.  ?   Heart sounds: Normal heart sounds. No murmur heard. ?  No gallop.  ?Pulmonary:  ?   Effort: Pulmonary effort is normal. No respiratory distress.  ?   Breath sounds: Normal breath sounds. No stridor. No decreased breath sounds, wheezing, rhonchi or rales.  ?Abdominal:  ?   General: There is no distension.  ?   Palpations: Abdomen is soft.  ?   Tenderness: There is no abdominal tenderness. There is no rebound.  ?Musculoskeletal:     ?   General: No tenderness. Normal range of motion.  ?   Cervical back: Normal range of motion and neck supple.  ?Skin: ?   General: Skin is warm and dry.  ?   Findings: No abrasion or rash.  ?Neurological:  ?   Mental Status: He is alert and oriented to person, place, and time. Mental status is at  baseline.  ?   GCS: GCS eye subscore is 4. GCS verbal subscore is 5. GCS motor subscore is 6.  ?   Cranial Nerves: No cranial nerve deficit.  ?   Sensory: No sensory deficit.  ?   Motor: Motor function is intact.  ?Psychiatric:     ?   Attention and Perception: Attention normal.     ?   Speech: Speech normal.     ?   Behavior: Behavior normal.  ? ? ?ED Results / Procedures / Treatments   ?Labs ?(all labs ordered are listed, but only abnormal results are displayed) ?Labs Reviewed - No data to display ? ?EKG ?None ? ?Radiology ?No results found. ? ?Procedures ?Procedures  ? ? ?Medications Ordered in ED ?Medications - No data to display ? ?ED Course/ Medical Decision Making/ A&P ?  ?                        ?Medical Decision Making ?Risk ?Prescription drug management. ? ? 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  Considered acute narrow angle-closure glaucoma but patient's intraocular pressure here was checked twice and was 21 and 22.  He does have some upper lid edema as well 2 but no obvious lesions.  Medicated for pain with Percocet.  Discussed with Dr. Sherrine Maples, ophthalmologist on-call, he will see the patient in approximately 5 hours in his office.  Patient given this information in front of his friend who will make sure that he gets to the appointment. ? ? ? ? ? ? ? ?Final Clinical Impression(s) / ED Diagnoses ?Final diagnoses:  ?None  ? ? ?Rx / DC Orders ?ED Discharge Orders   ? ? None  ? ?  ? ? ?  ?Lorre Nick, MD ?09/04/21 0901 ? ?

## 2021-09-04 NOTE — ED Triage Notes (Signed)
Pt coming from home with c/o left eye swelling and excessive eye watering x 3 days. Pt has hx glaucoma. Pt reports starting Monia Sabal a couple weeks ago, but reports no other new medications. ?

## 2023-01-01 NOTE — Progress Notes (Deleted)
Office Visit Note  Patient: Martin Freeman             Date of Birth: 06-18-1961           MRN: [id number]             PCP: Tracey Harries, MD Referring: Tracey Harries, MD Visit Date: 01/15/2023 Occupation: @GUAROCC @  Subjective:  No chief complaint on file.   History of Present Illness: Martin Freeman is a 62 y.o. male ***   Patient underwent right eye cataract surgery in 2021.  In April 2022 he noted some vision change and also photophobia of the left eye.  Activities of Daily Living:  Patient reports morning stiffness for *** {minute/hour:19697}.   Patient {ACTIONS;DENIES/REPORTS:21021675::"Denies"} nocturnal pain.  Difficulty dressing/grooming: {ACTIONS;DENIES/REPORTS:21021675::"Denies"} Difficulty climbing stairs: {ACTIONS;DENIES/REPORTS:21021675::"Denies"} Difficulty getting out of chair: {ACTIONS;DENIES/REPORTS:21021675::"Denies"} Difficulty using hands for taps, buttons, cutlery, and/or writing: {ACTIONS;DENIES/REPORTS:21021675::"Denies"}  No Rheumatology ROS completed.   PMFS History:  There are no problems to display for this patient.   Past Medical History:  Diagnosis Date   Calcaneal fracture    left   Hypertension    states he stopped BP med on his own    No family history on file. Past Surgical History:  Procedure Laterality Date   CYST EXCISION PERINEAL     groin   EYE SURGERY     ORIF CALCANEOUS FRACTURE Left 08/25/2018   Procedure: OPEN REDUCTION INTERNAL FIXATION (ORIF) Left calcaneus fracture;  Surgeon: Toni Arthurs, MD;  Location: [facility];  Service: Orthopedics;  Laterality: Left;    TONSILLECTOMY     Social History   Social History Narrative   Not on file    There is no immunization history on file for this patient.   Objective: Vital Signs: There were no vitals taken for this visit.   Physical Exam   Musculoskeletal Exam: ***  CDAI Exam: CDAI Score: -- Patient Global: --; Provider Global:  -- Swollen: --; Tender: -- Joint Exam 01/15/2023   No joint exam has been documented for this visit   There is currently no information documented on the homunculus. Go to the Rheumatology activity and complete the homunculus joint exam.  Investigation: No additional findings.  Imaging: No results found.  Recent Labs: Lab Results  Component Value Date   WBC 5.9 03/09/2021   HGB 13.8 03/09/2021   PLT 189 03/09/2021   NA 141 03/09/2021   K 4.2 03/09/2021   CL 108 03/09/2021   CO2 28 03/09/2021   GLUCOSE 128 (H) 03/09/2021   BUN 13 03/09/2021   CREATININE 1.51 (H) 03/09/2021   BILITOT 0.8 03/09/2021   ALKPHOS 70 03/09/2021   AST 16 03/09/2021   ALT 13 03/09/2021   PROT 6.3 (L) 03/09/2021   ALBUMIN 3.6 03/09/2021   CALCIUM 8.6 (L) 03/09/2021   GFRAA >60 10/28/2017   March 2024 TB Gold negative, hepatitis B nonreactive, hepatitis C nonreactive, CRP 0.63, ESR 11, hepatic function panel normal, creatinine 1.2, CBC WBC 4.7 hemoglobin 13.5, platelets 232 July 22, 2022 CMP normal, LDL 80, sed rate 22, RF 142.9 August 07, 2022 ANA negative August 29, 2022 HIV negative   FINDINGS/IMPRESSION: 1. Normal cardiac and mediastinal contours . 2. Mild prominence of the bilateral hila. Consider further evaluation with chest CT with contrast to assess for underlying adenopathy. 3. Basilar predominant reticular with apparent bronchial wall thickening which may reflect mild bronchitis, though underlying interstitial lung disease is difficult to exclude. CT may be helpful  for further characterization. 4. No pneumothorax or pleural effusion.  Electronically Signed by: Felipa Furnace, MD, [facility] Electronically Signed on: 08/29/2022 12:10 PM   Speciality Comments: No specialty comments available.  Procedures:  No procedures performed Allergies: Patient has no known allergies.   Assessment / Plan:     Visit Diagnoses: No diagnosis found.  Orders: No orders of the defined  types were placed in this encounter.  No orders of the defined types were placed in this encounter.   Face-to-face time spent with patient was *** minutes. Greater than 50% of time was spent in counseling and coordination of care.  Follow-Up Instructions: No follow-ups on file.   Pollyann Savoy, MD  Note - This record has been created using Animal nutritionist.  Chart creation errors have been sought, but may not always  have been located. Such creation errors do not reflect on  the standard of medical care.

## 2023-01-15 ENCOUNTER — Encounter: Payer: BC Managed Care – PPO | Admitting: Rheumatology

## 2023-01-15 DIAGNOSIS — M0630 Rheumatoid nodule, unspecified site: Secondary | ICD-10-CM

## 2023-01-15 DIAGNOSIS — Z79899 Other long term (current) drug therapy: Secondary | ICD-10-CM

## 2023-01-15 DIAGNOSIS — E782 Mixed hyperlipidemia: Secondary | ICD-10-CM

## 2023-01-15 DIAGNOSIS — H209 Unspecified iridocyclitis: Secondary | ICD-10-CM

## 2023-01-15 DIAGNOSIS — I10 Essential (primary) hypertension: Secondary | ICD-10-CM

## 2023-01-15 DIAGNOSIS — M0579 Rheumatoid arthritis with rheumatoid factor of multiple sites without organ or systems involvement: Secondary | ICD-10-CM

## 2023-01-15 DIAGNOSIS — E119 Type 2 diabetes mellitus without complications: Secondary | ICD-10-CM

## 2023-01-15 DIAGNOSIS — M79641 Pain in right hand: Secondary | ICD-10-CM

## 2023-01-15 DIAGNOSIS — G8929 Other chronic pain: Secondary | ICD-10-CM

## 2023-01-15 DIAGNOSIS — Z8669 Personal history of other diseases of the nervous system and sense organs: Secondary | ICD-10-CM

## 2023-02-12 ENCOUNTER — Ambulatory Visit: Payer: BC Managed Care – PPO | Admitting: Rheumatology

## 2023-03-14 IMAGING — DX DG CHEST 1V PORT
1 series · 1 of 1 positions shown · non-contrast
Comparison: 10/20/2003

CLINICAL DATA: Syncope

EXAM:
PORTABLE CHEST 1 VIEW

[chest ap]
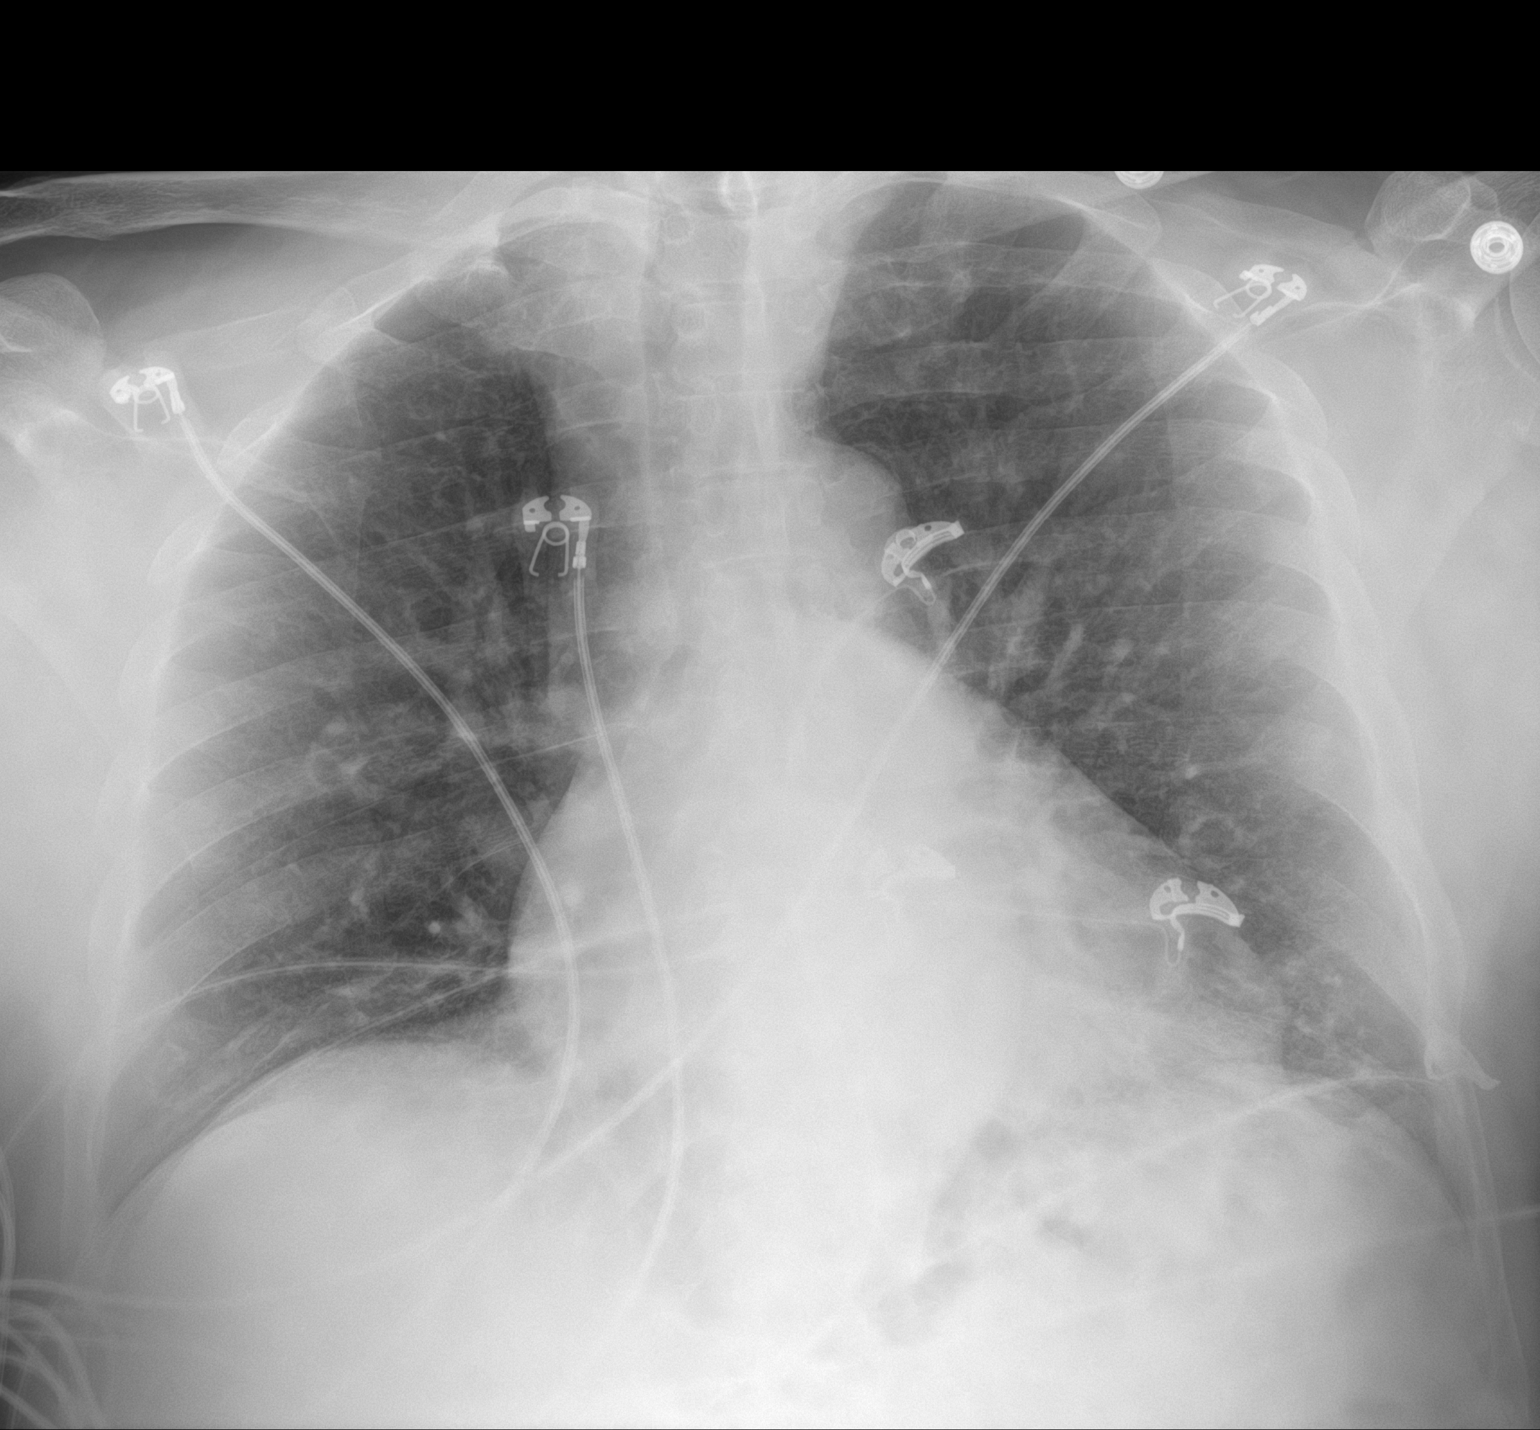

[1 of 1 positions shown; findings below may reference images not displayed]

FINDINGS: The heart size and mediastinal contours are within normal limits,
when accounting for projection and low lung volumes. No focal
pulmonary opacity. No pleural effusion or pneumothorax. No acute
osseous abnormality.
IMPRESSION: No active disease.

## 2023-09-28 ENCOUNTER — Encounter (HOSPITAL_BASED_OUTPATIENT_CLINIC_OR_DEPARTMENT_OTHER): Payer: Self-pay | Admitting: Emergency Medicine

## 2023-09-28 ENCOUNTER — Other Ambulatory Visit: Payer: Self-pay

## 2023-09-28 ENCOUNTER — Emergency Department (HOSPITAL_BASED_OUTPATIENT_CLINIC_OR_DEPARTMENT_OTHER)
Admission: EM | Admit: 2023-09-28 | Discharge: 2023-09-28 | Disposition: A | Discharge status: Home / Self Care | Attending: Emergency Medicine | Admitting: Emergency Medicine

## 2023-09-28 ENCOUNTER — Emergency Department (HOSPITAL_BASED_OUTPATIENT_CLINIC_OR_DEPARTMENT_OTHER)

## 2023-09-28 DIAGNOSIS — R519 Headache, unspecified: Secondary | ICD-10-CM | POA: Diagnosis present

## 2023-09-28 DIAGNOSIS — Z79899 Other long term (current) drug therapy: Secondary | ICD-10-CM | POA: Diagnosis not present

## 2023-09-28 DIAGNOSIS — Z7982 Long term (current) use of aspirin: Secondary | ICD-10-CM | POA: Insufficient documentation

## 2023-09-28 DIAGNOSIS — I10 Essential (primary) hypertension: Secondary | ICD-10-CM | POA: Diagnosis not present

## 2023-09-28 DIAGNOSIS — F17210 Nicotine dependence, cigarettes, uncomplicated: Secondary | ICD-10-CM | POA: Insufficient documentation

## 2023-09-28 DIAGNOSIS — G43809 Other migraine, not intractable, without status migrainosus: Secondary | ICD-10-CM | POA: Diagnosis not present

## 2023-09-28 LAB — CBC WITH DIFFERENTIAL/PLATELET
Abs Immature Granulocytes: 0.01 10*3/uL (ref 0.00–0.07)
Basophils Absolute: 0 10*3/uL (ref 0.0–0.1)
Basophils Relative: 0 %
Eosinophils Absolute: 0.1 10*3/uL (ref 0.0–0.5)
Eosinophils Relative: 1 %
HCT: 37 % — ABNORMAL LOW (ref 39.0–52.0)
Hemoglobin: 12.5 g/dL — ABNORMAL LOW (ref 13.0–17.0)
Immature Granulocytes: 0 %
Lymphocytes Relative: 25 %
Lymphs Abs: 1.2 10*3/uL (ref 0.7–4.0)
MCH: 32.7 pg (ref 26.0–34.0)
MCHC: 33.8 g/dL (ref 30.0–36.0)
MCV: 96.9 fL (ref 80.0–100.0)
Monocytes Absolute: 0.5 10*3/uL (ref 0.1–1.0)
Monocytes Relative: 11 %
Neutro Abs: 3 10*3/uL (ref 1.7–7.7)
Neutrophils Relative %: 63 %
Platelets: 224 10*3/uL (ref 150–400)
RBC: 3.82 MIL/uL — ABNORMAL LOW (ref 4.22–5.81)
RDW: 13.9 % (ref 11.5–15.5)
WBC: 4.7 10*3/uL (ref 4.0–10.5)
nRBC: 0 % (ref 0.0–0.2)

## 2023-09-28 LAB — COMPREHENSIVE METABOLIC PANEL WITH GFR
ALT: 25 U/L (ref 0–44)
AST: 20 U/L (ref 15–41)
Albumin: 4 g/dL (ref 3.5–5.0)
Alkaline Phosphatase: 71 U/L (ref 38–126)
Anion gap: 6 (ref 5–15)
BUN: 14 mg/dL (ref 8–23)
CO2: 29 mmol/L (ref 22–32)
Calcium: 8.8 mg/dL — ABNORMAL LOW (ref 8.9–10.3)
Chloride: 102 mmol/L (ref 98–111)
Creatinine, Ser: 1.38 mg/dL — ABNORMAL HIGH (ref 0.61–1.24)
GFR, Estimated: 57 mL/min — ABNORMAL LOW (ref >60)
Glucose, Bld: 194 mg/dL — ABNORMAL HIGH (ref 70–99)
Potassium: 4.5 mmol/L (ref 3.5–5.1)
Sodium: 137 mmol/L (ref 135–145)
Total Bilirubin: 0.5 mg/dL (ref 0.0–1.2)
Total Protein: 6.8 g/dL (ref 6.5–8.1)

## 2023-09-28 NOTE — ED Triage Notes (Signed)
 BIB EMS, C/o sudden headache starting around 0740 this morning. Dizziness. Endorses "projectile vomiting". Visually impaired at baseline but states "vision is a little worse". States "more blurred than normal" No unilateral deficits.

## 2023-09-28 NOTE — Discharge Instructions (Addendum)
 We evaluated you for your headache.  Your testing in the emergency department is reassuring.  Please follow-up closely with your primary care doctor.  We obtained testing including a CT scan of your head and laboratory testing which did not show any dangerous findings.  Please return for any worsening symptoms.

## 2023-09-28 NOTE — ED Notes (Signed)
 Redraw lt Green, done. Sent to lab

## 2023-09-28 NOTE — ED Provider Notes (Signed)
 [place] EMERGENCY DEPARTMENT AT [facility] Provider Note  CSN: [id number] Arrival date & time: 09/28/23 1023  Chief Complaint(s) Headache  HPI Martin Freeman is a 63 y.o. male history of hypertension, chronic eye disorder presenting to the emergency department with headache.  Patient reports history of migraine headaches.  Does get them periodically.  Had episode today that felt like prior migraine headache, but was very severe and worse than typical.  Started slowly and became maximal.  He took some over-the-counter migraine medication and reports that now he has no headache.  He called paramedics before headache resolved as well because he was worried that he was going to pass out he felt lightheaded.  Reports nausea and vomiting.  Also reports some acute on chronic blurry vision.  This is also improved.  No numbness or tingling, weakness, syncope, fevers or chills, neck stiffness, abdominal pain or other new symptoms.   Past Medical History Past Medical History:  Diagnosis Date   Calcaneal fracture    left   Hypertension    states he stopped BP med on his own   There are no active problems to display for this patient.  Home Medication(s) Prior to Admission medications   Medication Sig Start Date End Date Taking? Authorizing Provider  acetaminophen (TYLENOL) 500 MG tablet Take 1,000 mg by mouth every 6 (six) hours as needed for mild pain or headache.    [provider]  amLODipine (NORVASC) 5 MG tablet Take 1 tablet (5 mg total) by mouth daily. 03/10/21   Tilden Fossa, MD  aspirin EC 81 MG tablet Take 1 tablet (81 mg total) by mouth 2 (two) times daily. Patient not taking: No sig reported 08/25/18   Toni Arthurs, MD  HYDROcodone-acetaminophen (NORCO/VICODIN) 5-325 MG tablet Take 1 tablet by mouth every 4 (four) hours as needed for moderate pain. Patient not taking: No sig reported 04/26/19   Gilda Crease, MD  ibuprofen (ADVIL) 800 MG tablet Take 1  tablet (800 mg total) by mouth every 6 (six) hours as needed for moderate pain. Patient not taking: No sig reported 04/26/19   Gilda Crease, MD  Multiple Vitamins-Minerals (MULTI-VITAMIN GUMMIES PO) Take 4 tablets by mouth daily.    [provider]  prednisoLONE acetate (PRED FORTE) 1 % ophthalmic suspension Place 1 drop into the left eye in the morning and at bedtime.    [provider]                                                                                                                                    Past Surgical History Past Surgical History:  Procedure Laterality Date   CYST EXCISION PERINEAL     groin   EYE SURGERY     ORIF CALCANEOUS FRACTURE Left 08/25/2018   Procedure: OPEN REDUCTION INTERNAL FIXATION (ORIF) Left calcaneus fracture;  Surgeon: Toni Arthurs, MD;  Location: [facility];  Service: Orthopedics;  Laterality: Left;    TONSILLECTOMY     Family History History reviewed. No pertinent family history.  Social History Social History   Tobacco Use   Smoking status: Every Day    Current packs/day: 1.00    Types: Cigarettes   Smokeless tobacco: Never  Substance Use Topics   Alcohol use: Not Currently   Drug use: Never   Allergies Patient has no known allergies.  Review of Systems Review of Systems  All other systems reviewed and are negative.   Physical Exam Vital Signs  I have reviewed the triage vital signs BP 124/86 (BP Location: Right Arm)   Pulse 71   Temp 97.9 F (36.6 C) (Oral)   Resp 12   SpO2 98%  Physical Exam Vitals and nursing note reviewed.  Constitutional:      General: He is not in acute distress.    Appearance: Normal appearance.  HENT:     Mouth/Throat:     Mouth: Mucous membranes are moist.  Eyes:     Conjunctiva/sclera: Conjunctivae normal.     Comments: Right pupil reactive, left pupil chronically opacified  Cardiovascular:     Rate and Rhythm: Normal rate and regular  rhythm.  Pulmonary:     Effort: Pulmonary effort is normal. No respiratory distress.     Breath sounds: Normal breath sounds.  Abdominal:     General: Abdomen is flat.     Palpations: Abdomen is soft.     Tenderness: There is no abdominal tenderness.  Musculoskeletal:     Right lower leg: No edema.     Left lower leg: No edema.  Skin:    General: Skin is warm and dry.     Capillary Refill: Capillary refill takes less than 2 seconds.  Neurological:     Mental Status: He is alert and oriented to person, place, and time. Mental status is at baseline.     Comments: Cranial nerves II through XII intact, strength 5 out of 5 in the bilateral upper and lower extremities, no sensory deficit to light touch, no dysmetria on finger-nose-finger testing, ambulatory with steady gait  Psychiatric:        Mood and Affect: Mood normal.        Behavior: Behavior normal.     ED Results and Treatments Labs (all labs ordered are listed, but only abnormal results are displayed) Labs Reviewed  CBC WITH DIFFERENTIAL/PLATELET - Abnormal; Notable for the following components:      Result Value   RBC 3.82 (*)    Hemoglobin 12.5 (*)    HCT 37.0 (*)    All other components within normal limits  COMPREHENSIVE METABOLIC PANEL WITH GFR - Abnormal; Notable for the following components:   Glucose, Bld 194 (*)    Creatinine, Ser 1.38 (*)    Calcium 8.8 (*)    GFR, Estimated 57 (*)    All other components within normal limits  Radiology CT Head Wo Contrast Result Date: 09/28/2023 CLINICAL DATA:  63 year old male sudden severe headache. EXAM: CT HEAD WITHOUT CONTRAST TECHNIQUE: Contiguous axial images were obtained from the base of the skull through the vertex without intravenous contrast. RADIATION DOSE REDUCTION: This exam was performed according to the departmental dose-optimization program  which includes automated exposure control, adjustment of the mA and/or kV according to patient size and/or use of iterative reconstruction technique. COMPARISON:  None Available. FINDINGS: Brain: No midline shift, ventriculomegaly, mass effect, evidence of mass lesion, intracranial hemorrhage or evidence of cortically based acute infarction. Patchy bilateral cerebral white matter hypodensity, moderate for age. No cortical encephalomalacia identified. Vascular: No suspicious intracranial vascular hyperdensity. Skull: Intact.  No acute osseous abnormality identified. Sinuses/Orbits: Visualized paranasal sinuses and mastoids are well aerated. Other: Postoperative changes to both globes. No acute orbit or scalp soft tissue finding. IMPRESSION: No acute intracranial abnormality. Moderate for age cerebral white matter changes, most commonly due to small vessel disease. Electronically Signed   By: Odessa Fleming M.D.   On: 09/28/2023 12:09    Pertinent labs & imaging results that were available during my care of the patient were reviewed by me and considered in my medical decision making (see MDM for details).  Medications Ordered in ED Medications - No data to display                                                                                                                                   Procedures Procedures  (including critical care time)  Medical Decision Making / ED Course   MDM:  63 year old presenting to the emergency department with chronic migraine headache symptoms, now resolved.  Patient overall well-appearing, given sudden onset headache, age, although higher concern for migraine will obtain CT head to evaluate for other dangerous process such as subarachnoid hemorrhage, subdural hemorrhage, intraparenchymal bleed, brain tumor.  Will obtain basic laboratory testing as well.  His neurologic exam is normal.  He feels currently at baseline.  Does not want any medication.  If his CT head and  laboratory testing is reassuring, anticipate discharge, recommend follow-up with his primary doctor  Clinical Course as of 09/28/23 1447  Mon Sep 28, 2023  1446 Workup overall reassuring.  CT head negative.  Discussed findings with patient.  He feels comfortable with discharge. Will discharge patient to home. All questions answered. Patient comfortable with plan of discharge. Return precautions discussed with patient and specified on the after visit summary.  [WS]    Clinical Course User Index [WS] Lonell Grandchild, MD     Additional history obtained:  -External records from outside source obtained and reviewed including: Chart review including previous notes, labs, imaging, consultation notes including prior notes    Lab Tests: -I ordered, reviewed, and interpreted labs.   The pertinent results include:   Labs Reviewed  CBC WITH DIFFERENTIAL/PLATELET - Abnormal; Notable for the following components:  Result Value   RBC 3.82 (*)    Hemoglobin 12.5 (*)    HCT 37.0 (*)    All other components within normal limits  COMPREHENSIVE METABOLIC PANEL WITH GFR - Abnormal; Notable for the following components:   Glucose, Bld 194 (*)    Creatinine, Ser 1.38 (*)    Calcium 8.8 (*)    GFR, Estimated 57 (*)    All other components within normal limits    Notable for mild anemia   EKG   EKG Interpretation Date/Time:  Monday September 28 2023 10:42:13 EDT Ventricular Rate:  75 PR Interval:  188 QRS Duration:  76 QT Interval:  378 QTC Calculation: 423 R Axis:   17  Text Interpretation: Sinus rhythm Posterior infarct, old Nonspecific T abnormalities, lateral leads Borderline ST elevation, lateral leads No significant change since last tracing Confirmed by Alvino Blood ([id number]) on 09/28/2023 10:57:54 AM         Imaging Studies ordered: I ordered imaging studies including CT head  On my interpretation imaging demonstrates no acute process I independently visualized and  interpreted imaging. I agree with the radiologist interpretation   Medicines ordered and prescription drug management: No orders of the defined types were placed in this encounter.   -I have reviewed the patients home medicines and have made adjustments as needed  Social Determinants of Health:  Diagnosis or treatment significantly limited by social determinants of health: obesity   Reevaluation: After the interventions noted above, I reevaluated the patient and found that their symptoms have resolved  Co morbidities that complicate the patient evaluation  Past Medical History:  Diagnosis Date   Calcaneal fracture    left   Hypertension    states he stopped BP med on his own      Dispostion: Disposition decision including need for hospitalization was considered, and patient discharged from emergency department.    Final Clinical Impression(s) / ED Diagnoses Final diagnoses:  Other migraine without status migrainosus, not intractable     This chart was dictated using voice recognition software.  Despite best efforts to proofread,  errors can occur which can change the documentation meaning.    Lonell Grandchild, MD 09/28/23 1447

## 2023-09-28 NOTE — ED Notes (Signed)
 Discharge paperwork given and verbally understood.

## 2024-01-14 ENCOUNTER — Emergency Department (HOSPITAL_COMMUNITY)
Admission: EM | Admit: 2024-01-14 | Discharge: 2024-01-14 | Disposition: A | Discharge status: Home / Self Care | Attending: Emergency Medicine | Admitting: Emergency Medicine

## 2024-01-14 ENCOUNTER — Other Ambulatory Visit: Payer: Self-pay

## 2024-01-14 DIAGNOSIS — M79641 Pain in right hand: Secondary | ICD-10-CM | POA: Insufficient documentation

## 2024-01-14 DIAGNOSIS — M79642 Pain in left hand: Secondary | ICD-10-CM | POA: Insufficient documentation

## 2024-01-14 DIAGNOSIS — H1033 Unspecified acute conjunctivitis, bilateral: Secondary | ICD-10-CM | POA: Insufficient documentation

## 2024-01-14 DIAGNOSIS — Z79899 Other long term (current) drug therapy: Secondary | ICD-10-CM | POA: Diagnosis not present

## 2024-01-14 DIAGNOSIS — M25539 Pain in unspecified wrist: Secondary | ICD-10-CM | POA: Insufficient documentation

## 2024-01-14 LAB — RESP PANEL BY RT-PCR (RSV, FLU A&B, COVID)  RVPGX2
Influenza A by PCR: NEGATIVE
Influenza B by PCR: NEGATIVE
Resp Syncytial Virus by PCR: NEGATIVE
SARS Coronavirus 2 by RT PCR: NEGATIVE

## 2024-01-14 LAB — CBG MONITORING, ED
Glucose-Capillary: 184 mg/dL — ABNORMAL HIGH (ref 70–99)
Glucose-Capillary: 195 mg/dL — ABNORMAL HIGH (ref 70–99)

## 2024-01-14 MED ORDER — METHYLPREDNISOLONE SODIUM SUCC 125 MG IJ SOLR
125.0000 mg | Freq: Once | INTRAMUSCULAR | Status: AC
  Administered 2024-01-14: 125 mg via INTRAMUSCULAR
  Filled 2024-01-14: qty 2

## 2024-01-14 MED ORDER — ERYTHROMYCIN 5 MG/GM OP OINT: TOPICAL_OINTMENT | OPHTHALMIC | 0 refills | Status: AC

## 2024-01-14 NOTE — ED Triage Notes (Signed)
 Patient c/o bilateral hand pain,nose and eyes running since yesterday. Patient requesting for his blood sugar to be checked.  No other complaints at this time.

## 2024-01-14 NOTE — ED Provider Notes (Signed)
 [place] EMERGENCY DEPARTMENT AT [facility] Provider Note   CSN: [id number] Arrival date & time: 01/14/24  9881     Patient presents with: Hand Pain and Nasal Congestion   Martin Freeman is a 63 y.o. male.   63 year old male presents today for multiple complaints.  He endorses drainage from bilateral eyes that he noticed since the past 2 days, as well as more pain in his wrist and both hands.  He does have history of rheumatoid arthritis.  States he has p.o. antibiotics that he has been prescribed but he has not taken them.  Patient is legally blind.  Has history of cataracts.  Denies any fever.  The history is provided by the patient. No language interpreter was used.       Prior to Admission medications   Medication Sig Start Date End Date Taking? Authorizing Provider  acetaminophen  (TYLENOL ) 500 MG tablet Take 1,000 mg by mouth every 6 (six) hours as needed for mild pain or headache.    [provider]  amLODipine  (NORVASC ) 5 MG tablet Take 1 tablet (5 mg total) by mouth daily. 03/10/21   Griselda Norris, MD  aspirin  EC 81 MG tablet Take 1 tablet (81 mg total) by mouth 2 (two) times daily. Patient not taking: No sig reported 08/25/18   Kit Rush, MD  HYDROcodone -acetaminophen  (NORCO/VICODIN) 5-325 MG tablet Take 1 tablet by mouth every 4 (four) hours as needed for moderate pain. Patient not taking: No sig reported 04/26/19   Haze Lonni PARAS, MD  ibuprofen  (ADVIL ) 800 MG tablet Take 1 tablet (800 mg total) by mouth every 6 (six) hours as needed for moderate pain. Patient not taking: No sig reported 04/26/19   Haze Lonni PARAS, MD  Multiple Vitamins-Minerals (MULTI-VITAMIN GUMMIES PO) Take 4 tablets by mouth daily.    [provider]  prednisoLONE acetate (PRED FORTE) 1 % ophthalmic suspension Place 1 drop into the left eye in the morning and at bedtime.    [provider]    Allergies: Patient has no known allergies.     Review of Systems  Constitutional:  Negative for chills and fever.  Eyes:  Positive for discharge. Negative for photophobia, pain and visual disturbance.  Musculoskeletal:  Positive for arthralgias.  Neurological:  Negative for light-headedness.  All other systems reviewed and are negative.   Updated Vital Signs BP 136/86 (BP Location: Left Arm)   Pulse 90   Temp 98.4 F (36.9 C)   Resp 18   SpO2 98%   Physical Exam Vitals and nursing note reviewed.  Constitutional:      General: He is not in acute distress.    Appearance: Normal appearance. He is not ill-appearing.  HENT:     Head: Normocephalic and atraumatic.     Nose: Nose normal.  Eyes:     Conjunctiva/sclera: Conjunctivae normal.  Cardiovascular:     Rate and Rhythm: Normal rate and regular rhythm.  Pulmonary:     Effort: Pulmonary effort is normal. No respiratory distress.  Musculoskeletal:        General: No deformity. Normal range of motion.     Cervical back: Normal range of motion.  Skin:    Findings: No rash.  Neurological:     Mental Status: He is alert.     (all labs ordered are listed, but only abnormal results are displayed) Labs Reviewed  CBG MONITORING, ED - Abnormal; Notable for the following components:      Result Value  Glucose-Capillary 184 (*)    All other components within normal limits  CBG MONITORING, ED - Abnormal; Notable for the following components:   Glucose-Capillary 195 (*)    All other components within normal limits  RESP PANEL BY RT-PCR (RSV, FLU A&B, COVID)  [id number]    EKG: None  Radiology: No results found.   Procedures   Medications Ordered in the ED  methylPREDNISolone  sodium succinate (SOLU-MEDROL ) 125 mg/2 mL injection 125 mg (125 mg Intramuscular Given 01/14/24 0743)                                    Medical Decision Making Risk Prescription drug management.   Patient presents with multiple complaints.  He is a 63 year old male with past medical  history significant for being legally blind, rheumatoid arthritis.  He does report compliance with methotrexate and other oral a regimen. He is a diabetic.  Reports compliance with home medicines.  Not on insulin. Blood sugar today 195.  Will give him 1 dose of Solu-Medrol  and he will take his home steroids and keep a close eye on his blood sugar.  Will also prescribe him erythromycin  eye ointment.  Does not wear contacts. Discharged in stable condition.  Return precaution discussed.  Patient voices understanding and is in agreement with plan.   Final diagnoses:  Acute conjunctivitis of both eyes, unspecified acute conjunctivitis type  Bilateral hand pain    ED Discharge Orders          Ordered    erythromycin  ophthalmic ointment        01/14/24 0844               Hildegard Loge, PA-C 01/14/24 9155    Armenta Canning, MD 01/16/24 1721

## 2024-01-14 NOTE — Discharge Instructions (Signed)
 Antibiotic ointment prescribed.  Keep a close eye on your blood sugar while you take your steroids.  Follow-up with your primary care doctor for reevaluation.  Return to the emergency department for any concerning symptoms.

## 2024-01-14 NOTE — ED Notes (Signed)
 Patient BS 184

## 2024-03-31 ENCOUNTER — Other Ambulatory Visit: Payer: Self-pay

## 2024-03-31 ENCOUNTER — Emergency Department (HOSPITAL_COMMUNITY)
Admission: EM | Admit: 2024-03-31 | Discharge: 2024-04-01 | Disposition: A | Discharge status: Home / Self Care | Attending: Emergency Medicine | Admitting: Emergency Medicine

## 2024-03-31 ENCOUNTER — Encounter (HOSPITAL_COMMUNITY): Payer: Self-pay

## 2024-03-31 DIAGNOSIS — Z79899 Other long term (current) drug therapy: Secondary | ICD-10-CM | POA: Insufficient documentation

## 2024-03-31 DIAGNOSIS — K5900 Constipation, unspecified: Secondary | ICD-10-CM | POA: Insufficient documentation

## 2024-03-31 DIAGNOSIS — I10 Essential (primary) hypertension: Secondary | ICD-10-CM | POA: Diagnosis not present

## 2024-03-31 DIAGNOSIS — Z7982 Long term (current) use of aspirin: Secondary | ICD-10-CM | POA: Insufficient documentation

## 2024-03-31 LAB — URINALYSIS, ROUTINE W REFLEX MICROSCOPIC
Bacteria, UA: NONE SEEN
Bilirubin Urine: NEGATIVE
Glucose, UA: >=500 mg/dL — AB
Hgb urine dipstick: NEGATIVE
Ketones, ur: NEGATIVE mg/dL
Leukocytes,Ua: NEGATIVE
Nitrite: NEGATIVE
Protein, ur: NEGATIVE mg/dL
Specific Gravity, Urine: 1.031 — ABNORMAL HIGH (ref 1.005–1.030)
pH: 6 (ref 5.0–8.0)

## 2024-03-31 NOTE — ED Triage Notes (Signed)
 BIB [facility] from home for constipation for 2 days. Pt has not taken anything at home to relieve constipation.

## 2024-04-01 ENCOUNTER — Emergency Department (HOSPITAL_COMMUNITY)

## 2024-04-01 LAB — CBC WITH DIFFERENTIAL/PLATELET
Abs Immature Granulocytes: 0.01 K/uL (ref 0.00–0.07)
Basophils Absolute: 0 K/uL (ref 0.0–0.1)
Basophils Relative: 0 %
Eosinophils Absolute: 0 K/uL (ref 0.0–0.5)
Eosinophils Relative: 0 %
HCT: 39 % (ref 39.0–52.0)
Hemoglobin: 12.6 g/dL — ABNORMAL LOW (ref 13.0–17.0)
Immature Granulocytes: 0 %
Lymphocytes Relative: 17 %
Lymphs Abs: 1 K/uL (ref 0.7–4.0)
MCH: 31.4 pg (ref 26.0–34.0)
MCHC: 32.3 g/dL (ref 30.0–36.0)
MCV: 97.3 fL (ref 80.0–100.0)
Monocytes Absolute: 0.4 K/uL (ref 0.1–1.0)
Monocytes Relative: 6 %
Neutro Abs: 4.4 K/uL (ref 1.7–7.7)
Neutrophils Relative %: 77 %
Platelets: 310 K/uL (ref 150–400)
RBC: 4.01 MIL/uL — ABNORMAL LOW (ref 4.22–5.81)
RDW: 13.4 % (ref 11.5–15.5)
WBC: 5.8 K/uL (ref 4.0–10.5)
nRBC: 0 % (ref 0.0–0.2)

## 2024-04-01 LAB — COMPREHENSIVE METABOLIC PANEL WITH GFR
ALT: 14 U/L (ref 0–44)
AST: 18 U/L (ref 15–41)
Albumin: 4.1 g/dL (ref 3.5–5.0)
Alkaline Phosphatase: 100 U/L (ref 38–126)
Anion gap: 10 (ref 5–15)
BUN: 16 mg/dL (ref 8–23)
CO2: 25 mmol/L (ref 22–32)
Calcium: 9.6 mg/dL (ref 8.9–10.3)
Chloride: 104 mmol/L (ref 98–111)
Creatinine, Ser: 1.36 mg/dL — ABNORMAL HIGH (ref 0.61–1.24)
GFR, Estimated: 58 mL/min — ABNORMAL LOW (ref >60)
Glucose, Bld: 133 mg/dL — ABNORMAL HIGH (ref 70–99)
Potassium: 4.8 mmol/L (ref 3.5–5.1)
Sodium: 139 mmol/L (ref 135–145)
Total Bilirubin: 0.6 mg/dL (ref 0.0–1.2)
Total Protein: 7.6 g/dL (ref 6.5–8.1)

## 2024-04-01 MED ORDER — MAGNESIUM CITRATE PO SOLN
1.0000 | Freq: Once | ORAL | 0 refills | Status: AC
Start: 2024-04-01 — End: 2024-04-01

## 2024-04-01 MED ORDER — MAGNESIUM CITRATE PO SOLN
1.0000 | Freq: Once | ORAL | Status: AC
  Administered 2024-04-01: 1 via ORAL
  Filled 2024-04-01: qty 296

## 2024-04-01 NOTE — ED Notes (Signed)
 Pt crying when attempting DC. Stated he is stopped up and can't go home like this. PA notified.

## 2024-04-01 NOTE — Discharge Instructions (Addendum)
 Please take the prescribed medication to help with your constipation. Follow-up with your primary care provider for further evaluation as needed. Return to the emergency department if you develop any life threatening symptoms

## 2024-04-01 NOTE — ED Provider Notes (Signed)
 [place] EMERGENCY DEPARTMENT AT [facility] Provider Note   CSN: [id number] Arrival date & time: 03/31/24  2151     Patient presents with: Constipation   Martin Freeman is a 63 y.o. male.  Patient with past medical history significant for hypertension, recent antibiotic usage due to foot procedure with podiatry presents to the emergency department via ambulance complaining of 2 days of constipation, increased urination, and increased sleepiness.  He denies abdominal pain, nausea, vomiting, chest pain, shortness of breath.  He states his last bowel movement was 2 days ago.  He has not tried any medications at home for his constipation.  Patient also complains of seeing what he thought was a cyst under his scrotum.  No fever, nausea, vomiting    Constipation      Prior to Admission medications   Medication Sig Start Date End Date Taking? Authorizing Provider  albuterol (VENTOLIN HFA) 108 (90 Base) MCG/ACT inhaler Inhale 2 puffs into the lungs every 6 (six) hours as needed. 11/26/23  Yes [provider]  amLODipine -benazepril (LOTREL) 10-40 MG capsule Take 1 capsule by mouth daily.   Yes [provider]  azelastine (ASTELIN) 0.1 % nasal spray Place 2 sprays into both nostrils 2 (two) times daily as needed. 11/12/23  Yes [provider]  empagliflozin (JARDIANCE) 10 MG TABS tablet Take 10 mg by mouth daily. 03/07/24  Yes [provider]  folic acid (FOLVITE) 1 MG tablet Take 1 mg by mouth daily.   Yes [provider]  magnesium citrate SOLN Take 296 mLs (1 Bottle total) by mouth once for 1 dose. 04/01/24 04/01/24 Yes Logan Ubaldo NOVAK, PA-C  meloxicam (MOBIC) 15 MG tablet Take 15 mg by mouth daily.   Yes [provider]  metFORMIN (GLUCOPHAGE-XR) 500 MG 24 hr tablet Take 500 mg by mouth 2 (two) times daily.   Yes [provider]  methotrexate (RHEUMATREX) 2.5 MG tablet Take 25 mg by mouth once a week.   Yes  [provider]  traMADol (ULTRAM) 50 MG tablet Take 50 mg by mouth every 8 (eight) hours as needed.   Yes [provider]  aspirin  EC 81 MG tablet Take 1 tablet (81 mg total) by mouth 2 (two) times daily. Patient not taking: Reported on 04/26/2019 08/25/18   Kit Rush, MD  erythromycin  ophthalmic ointment Place a 1/2 inch ribbon of ointment into the lower eyelid. 01/14/24   Hildegard Loge, PA-C  HYDROcodone -acetaminophen  (NORCO/VICODIN) 5-325 MG tablet Take 1 tablet by mouth every 4 (four) hours as needed for moderate pain. Patient not taking: Reported on 03/10/2021 04/26/19   Haze Lonni PARAS, MD  ibuprofen  (ADVIL ) 800 MG tablet Take 1 tablet (800 mg total) by mouth every 6 (six) hours as needed for moderate pain. Patient not taking: Reported on 03/10/2021 04/26/19   Haze Lonni PARAS, MD    Allergies: Patient has no known allergies.    Review of Systems  Gastrointestinal:  Positive for constipation.    Updated Vital Signs BP 139/89   Pulse 82   Temp 98.2 F (36.8 C)   Resp 16   Ht 6' 3 (1.905 m)   Wt 124.3 kg   SpO2 99%   BMI 34.25 kg/m   Physical Exam Vitals and nursing note reviewed.  Constitutional:      General: He is not in acute distress.    Appearance: He is well-developed.  HENT:     Head: Normocephalic and atraumatic.  Eyes:  Conjunctiva/sclera: Conjunctivae normal.  Cardiovascular:     Rate and Rhythm: Normal rate and regular rhythm.     Heart sounds: No murmur heard. Pulmonary:     Effort: Pulmonary effort is normal. No respiratory distress.     Breath sounds: Normal breath sounds.  Abdominal:     Palpations: Abdomen is soft.     Tenderness: There is no abdominal tenderness.  Genitourinary:    Comments: No induration, fluctuance, drainage noted in the inguinal region or under the scrotum Musculoskeletal:        General: No swelling.     Cervical back: Neck supple.  Skin:    General: Skin is warm and dry.     Capillary Refill:  Capillary refill takes less than 2 seconds.  Neurological:     Mental Status: He is alert.  Psychiatric:        Mood and Affect: Mood normal.     (all labs ordered are listed, but only abnormal results are displayed) Labs Reviewed  URINALYSIS, ROUTINE W REFLEX MICROSCOPIC - Abnormal; Notable for the following components:      Result Value   Specific Gravity, Urine 1.031 (*)    Glucose, UA >=500 (*)    All other components within normal limits  COMPREHENSIVE METABOLIC PANEL WITH GFR - Abnormal; Notable for the following components:   Glucose, Bld 133 (*)    Creatinine, Ser 1.36 (*)    GFR, Estimated 58 (*)    All other components within normal limits  CBC WITH DIFFERENTIAL/PLATELET - Abnormal; Notable for the following components:   RBC 4.01 (*)    Hemoglobin 12.6 (*)    All other components within normal limits    EKG: None  Radiology: DG Abdomen 1 View Result Date: 04/01/2024 CLINICAL DATA:  Constipation EXAM: ABDOMEN - 1 VIEW COMPARISON:  CT abdomen pelvis 10/28/2017 FINDINGS: The bowel gas pattern is normal. Stool within the hepatic flexure, sigmoid colon and rectum. 6 cm stool ball within the rectum. No radio-opaque calculi or other significant radiographic abnormality are seen. IMPRESSION: Nonobstructive bowel gas pattern. Electronically Signed   By: Morgane  Naveau M.D.   On: 04/01/2024 00:47     Procedures   Medications Ordered in the ED - No data to display                                  Medical Decision Making Amount and/or Complexity of Data Reviewed Labs: ordered. Radiology: ordered.   This patient presents to the ED for concern of constipation, tiredness, urinary frequency, possible cyst, this involves an extensive number of treatment options, and is a complaint that carries with it a high risk of complications and morbidity.  The differential diagnosis includes abscess, cellulitis, bowel obstruction, constipation, electrolyte abnormality, hyperglycemia,  urinary tract infection, others   Co morbidities / Chronic conditions that complicate the patient evaluation  Hypertension   Additional history obtained:  Additional history obtained from EMR External records from outside source obtained and reviewed including family medicine notes   Lab Tests:  I Ordered, and personally interpreted labs.  The pertinent results include: Grossly unremarkable CBC, CMP, UA   Imaging Studies ordered:  I ordered imaging studies including KUB I independently visualized and interpreted imaging which showed nonobstructive bowel gas pattern I agree with the radiologist interpretation    Social Determinants of Health:  Patient is a daily smoker   Test / Admission - Considered:  Patient with no significant findings on workup this evening.  He does appear to have some constipation.  Plan to recommend a bowel regimen and outpatient follow-up with his primary care provider.  I did inspect the skin where the patient was worried about a possible infection but no signs of cellulitis, abscess, or other abnormality noted.  Patient stable for discharge home.      Final diagnoses:  Constipation, unspecified constipation type    ED Discharge Orders          Ordered    magnesium citrate SOLN   Once        04/01/24 0350               Martin Rosenbloom B, PA-C 04/01/24 0352    Freeman, April, MD 04/01/24 [id number]

## 2024-04-09 ENCOUNTER — Emergency Department (HOSPITAL_COMMUNITY)
Admission: EM | Admit: 2024-04-09 | Discharge: 2024-04-09 | Disposition: A | Discharge status: Home / Self Care | Attending: Emergency Medicine | Admitting: Emergency Medicine

## 2024-04-09 ENCOUNTER — Other Ambulatory Visit: Payer: Self-pay

## 2024-04-09 ENCOUNTER — Encounter (HOSPITAL_COMMUNITY): Payer: Self-pay | Admitting: Emergency Medicine

## 2024-04-09 DIAGNOSIS — M059 Rheumatoid arthritis with rheumatoid factor, unspecified: Secondary | ICD-10-CM | POA: Diagnosis not present

## 2024-04-09 DIAGNOSIS — M2550 Pain in unspecified joint: Secondary | ICD-10-CM

## 2024-04-09 DIAGNOSIS — I10 Essential (primary) hypertension: Secondary | ICD-10-CM | POA: Insufficient documentation

## 2024-04-09 DIAGNOSIS — Z7982 Long term (current) use of aspirin: Secondary | ICD-10-CM | POA: Insufficient documentation

## 2024-04-09 DIAGNOSIS — Z79899 Other long term (current) drug therapy: Secondary | ICD-10-CM | POA: Insufficient documentation

## 2024-04-09 DIAGNOSIS — E119 Type 2 diabetes mellitus without complications: Secondary | ICD-10-CM | POA: Diagnosis not present

## 2024-04-09 DIAGNOSIS — M25531 Pain in right wrist: Secondary | ICD-10-CM | POA: Diagnosis present

## 2024-04-09 DIAGNOSIS — Z7984 Long term (current) use of oral hypoglycemic drugs: Secondary | ICD-10-CM | POA: Insufficient documentation

## 2024-04-09 HISTORY — DX: Type 2 diabetes mellitus without complications: E11.9

## 2024-04-09 LAB — CBC
HCT: 40.3 % (ref 39.0–52.0)
Hemoglobin: 13.1 g/dL (ref 13.0–17.0)
MCH: 31.5 pg (ref 26.0–34.0)
MCHC: 32.5 g/dL (ref 30.0–36.0)
MCV: 96.9 fL (ref 80.0–100.0)
Platelets: 309 K/uL (ref 150–400)
RBC: 4.16 MIL/uL — ABNORMAL LOW (ref 4.22–5.81)
RDW: 13.3 % (ref 11.5–15.5)
WBC: 5.1 K/uL (ref 4.0–10.5)
nRBC: 0 % (ref 0.0–0.2)

## 2024-04-09 LAB — URINALYSIS, ROUTINE W REFLEX MICROSCOPIC
Bacteria, UA: NONE SEEN
Bilirubin Urine: NEGATIVE
Glucose, UA: >=500 mg/dL — AB
Hgb urine dipstick: NEGATIVE
Ketones, ur: 5 mg/dL — AB
Leukocytes,Ua: NEGATIVE
Nitrite: NEGATIVE
Protein, ur: NEGATIVE mg/dL
Specific Gravity, Urine: 1.02 (ref 1.005–1.030)
pH: 5 (ref 5.0–8.0)

## 2024-04-09 LAB — COMPREHENSIVE METABOLIC PANEL WITH GFR
ALT: 18 U/L (ref 0–44)
AST: 18 U/L (ref 15–41)
Albumin: 3.5 g/dL (ref 3.5–5.0)
Alkaline Phosphatase: 83 U/L (ref 38–126)
Anion gap: 12 (ref 5–15)
BUN: 16 mg/dL (ref 8–23)
CO2: 19 mmol/L — ABNORMAL LOW (ref 22–32)
Calcium: 9.3 mg/dL (ref 8.9–10.3)
Chloride: 105 mmol/L (ref 98–111)
Creatinine, Ser: 1.39 mg/dL — ABNORMAL HIGH (ref 0.61–1.24)
GFR, Estimated: 57 mL/min — ABNORMAL LOW (ref >60)
Glucose, Bld: 137 mg/dL — ABNORMAL HIGH (ref 70–99)
Potassium: 4.2 mmol/L (ref 3.5–5.1)
Sodium: 136 mmol/L (ref 135–145)
Total Bilirubin: 0.8 mg/dL (ref 0.0–1.2)
Total Protein: 7.5 g/dL (ref 6.5–8.1)

## 2024-04-09 LAB — RESP PANEL BY RT-PCR (RSV, FLU A&B, COVID)  RVPGX2
Influenza A by PCR: NEGATIVE
Influenza B by PCR: NEGATIVE
Resp Syncytial Virus by PCR: NEGATIVE
SARS Coronavirus 2 by RT PCR: NEGATIVE

## 2024-04-09 LAB — LIPASE, BLOOD: Lipase: 34 U/L (ref 11–51)

## 2024-04-09 MED ORDER — OXYCODONE HCL 5 MG PO TABS
5.0000 mg | ORAL_TABLET | Freq: Once | ORAL | Status: AC
  Administered 2024-04-09: 5 mg via ORAL
  Filled 2024-04-09: qty 1

## 2024-04-09 MED ORDER — OXYCODONE HCL 5 MG PO TABS: 5.0000 mg | ORAL_TABLET | Freq: Four times a day (QID) | ORAL | 0 refills | Status: AC | PRN

## 2024-04-09 MED ORDER — OXYCODONE-ACETAMINOPHEN 5-325 MG PO TABS
1.0000 | ORAL_TABLET | Freq: Once | ORAL | Status: AC
  Administered 2024-04-09: 1 via ORAL
  Filled 2024-04-09: qty 1

## 2024-04-09 NOTE — Discharge Instructions (Addendum)
 Thank you for letting us  evaluate you today.  Your lab work was without significant electrolyte abnormalities.  Your white blood cells do not indicate infection. Your urine is without infection  I have sent pain medicine to your pharmacy.  Please make sure to drink plenty of water, take Metamucil, stool softeners to ensure you do not get constipated as this may increase your chances of getting constipated.  You may also use MiraLAX every day until you have a bowel movement if you do not have a bowel movement in 2-3 days.  Return to Emergency Department if you experience significant worsening symptoms

## 2024-04-09 NOTE — ED Triage Notes (Addendum)
 Pt BIB [facility] from home for increased Joint pain 7/10 and mobioity issues x 2 weeks.  Swelling in wrists and ankles.  Also some groin/pelvic pain and nausea.  Hx of RA. Pt is visually impaired.   Pt did have a toe procedure at [facility] about a month ago and was told to stop taking his Methotrexate so he could take an ABX for the toe concern. PT resumed Methotrexate on Tuesday.   130/84 HR 82 99% RA, CBG 146

## 2024-04-09 NOTE — ED Provider Triage Note (Signed)
 Emergency Medicine Provider Triage Evaluation Note  Martin Freeman , a 63 y.o. male  was evaluated in triage.  Pt complains of bilateral wrist, ankle pain, as well as hip pain.  He does endorse some mild nausea.  History of rheumatoid arthritis.  He reports some swelling in wrists and ankles.  Had to be off of methotrexate this week and take an antibiotic.  He resumed the methotrexate on Tuesday.  Rates the pain 7/10.SABRA  Review of Systems  Positive: Bilateral wrist, ankle ttp with mild swelling Negative:   Physical Exam  BP (!) 129/91   Pulse 88   Temp 98.4 F (36.9 C)   Resp 18   SpO2 100%  Gen:   Awake, no distress   Resp:  Normal effort  MSK:   Moves extremities without difficulty  Other:    Medical Decision Making  Medically screening exam initiated at 12:54 PM.  Appropriate orders placed.  IZAN MIRON was informed that the remainder of the evaluation will be completed by another provider, this initial triage assessment does not replace that evaluation, and the importance of remaining in the ED until their evaluation is complete.  Workup initiated in triage    Vickey, [place], PA-C 04/09/24 1254

## 2024-04-09 NOTE — ED Provider Notes (Signed)
 [place] EMERGENCY DEPARTMENT AT [facility] Provider Note   CSN: [id number] Arrival date & time: 04/09/24  1233     Patient presents with: Joint Pain   Martin Freeman is a 63 y.o. male with past medical history of non-insulin-dependent T2DM, HTN, RA (on methotrexate) presents to emergency department for evaluation of multiple complaints.  Most notably generalized weakness, joint pain for past 2 weeks.  Patient primarily hurts in bilateral wrists, shoulders, ankles, hips with mild swelling of hips and ankles.  Pain improves with ambulation. Reports that he recently was off of his methotrexate for a week to take antibiotic for foot procedure with podiatry but resumed methotrexate 5 days ago. Denies HA, fevers, rash, CP, SHOB  Also endorses nasal congestion for past week.  No coughing  Also endorses increased urinary frequency and burning with urination for past 3 weeks.  No flank pain or history of kidney stones.  No urinary retention.   HPI     Prior to Admission medications   Medication Sig Start Date End Date Taking? Authorizing Provider  oxyCODONE  (ROXICODONE ) 5 MG immediate release tablet Take 1 tablet (5 mg total) by mouth every 6 (six) hours as needed for up to 5 days for severe pain (pain score 7-10). 04/09/24 04/14/24 Yes Minnie Tinnie BRAVO, PA  albuterol (VENTOLIN HFA) 108 (90 Base) MCG/ACT inhaler Inhale 2 puffs into the lungs every 6 (six) hours as needed. 11/26/23   [provider]  amLODipine -benazepril (LOTREL) 10-40 MG capsule Take 1 capsule by mouth daily.    [provider]  aspirin  EC 81 MG tablet Take 1 tablet (81 mg total) by mouth 2 (two) times daily. Patient not taking: Reported on 04/26/2019 08/25/18   Kit Rush, MD  azelastine (ASTELIN) 0.1 % nasal spray Place 2 sprays into both nostrils 2 (two) times daily as needed. 11/12/23   [provider]  empagliflozin (JARDIANCE) 10 MG TABS tablet Take 10 mg by mouth daily. 03/07/24    [provider]  erythromycin  ophthalmic ointment Place a 1/2 inch ribbon of ointment into the lower eyelid. 01/14/24   Hildegard Loge, PA-C  folic acid (FOLVITE) 1 MG tablet Take 1 mg by mouth daily.    [provider]  HYDROcodone -acetaminophen  (NORCO/VICODIN) 5-325 MG tablet Take 1 tablet by mouth every 4 (four) hours as needed for moderate pain. Patient not taking: Reported on 03/10/2021 04/26/19   Haze Lonni PARAS, MD  ibuprofen  (ADVIL ) 800 MG tablet Take 1 tablet (800 mg total) by mouth every 6 (six) hours as needed for moderate pain. Patient not taking: Reported on 03/10/2021 04/26/19   Haze Lonni PARAS, MD  meloxicam (MOBIC) 15 MG tablet Take 15 mg by mouth daily.    [provider]  metFORMIN (GLUCOPHAGE-XR) 500 MG 24 hr tablet Take 500 mg by mouth 2 (two) times daily.    [provider]  methotrexate (RHEUMATREX) 2.5 MG tablet Take 25 mg by mouth once a week.    [provider]  traMADol (ULTRAM) 50 MG tablet Take 50 mg by mouth every 8 (eight) hours as needed.    [provider]    Allergies: Patient has no known allergies.    Review of Systems  Musculoskeletal:  Positive for joint swelling.    Updated Vital Signs BP (!) 166/98   Pulse 78   Temp 98.5 F (36.9 C)   Resp 20   SpO2 100%   Physical Exam Vitals and nursing note reviewed.  Constitutional:  General: He is not in acute distress.    Appearance: Normal appearance.  HENT:     Head: Normocephalic and atraumatic.  Eyes:     Conjunctiva/sclera: Conjunctivae normal.     Comments: Legally blind. Vision at baseline  Cardiovascular:     Rate and Rhythm: Normal rate.  Pulmonary:     Effort: Pulmonary effort is normal. No respiratory distress.  Musculoskeletal:     Comments: Generalized tenderness to wrists, shoulders, hips, ankles bilaterally. Mild swelling to wrists and ankles bilaterally. No erythema, warmth, drainage to joints. ROM WNL  Skin:     Coloration: Skin is not jaundiced or pale.  Neurological:     Mental Status: He is alert and oriented to person, place, and time. Mental status is at baseline.     (all labs ordered are listed, but only abnormal results are displayed) Labs Reviewed  CBC - Abnormal; Notable for the following components:      Result Value   RBC 4.16 (*)    All other components within normal limits  COMPREHENSIVE METABOLIC PANEL WITH GFR - Abnormal; Notable for the following components:   CO2 19 (*)    Glucose, Bld 137 (*)    Creatinine, Ser 1.39 (*)    GFR, Estimated 57 (*)    All other components within normal limits  URINALYSIS, ROUTINE W REFLEX MICROSCOPIC - Abnormal; Notable for the following components:   Glucose, UA >=500 (*)    Ketones, ur 5 (*)    All other components within normal limits  RESP PANEL BY RT-PCR (RSV, FLU A&B, COVID)  RVPGX2  LIPASE, BLOOD    EKG: None  Radiology: No results found.   Medications Ordered in the ED  oxyCODONE -acetaminophen  (PERCOCET/ROXICET) 5-325 MG per tablet 1 tablet (1 tablet Oral Given 04/09/24 1305)  oxyCODONE  (Oxy IR/ROXICODONE ) immediate release tablet 5 mg (5 mg Oral Given 04/09/24 1924)                                    Medical Decision Making Amount and/or Complexity of Data Reviewed Labs: ordered.  Risk Prescription drug management.   Patient presents to the ED for concern of joint pain, this involves an extensive number of treatment options, and is a complaint that carries with it a high risk of complications and morbidity.  The differential diagnosis includes OA, RA, cellulitis, septic joint, infection, COVID, Lyme disease, dehydration   Co morbidities that complicate the patient evaluation  RA - on methotrexate See HPI   Additional history obtained:  Additional history obtained from Nursing   External records from outside source obtained and reviewed including triage RN note   Lab Tests:  I Ordered, and personally  interpreted labs.  The pertinent results include:   No leukocytosis CBG 137 Creatinine 1.39 UA neg for infection, hgb Respiratory panel neg     Medicines ordered and prescription drug management:  I ordered medication including percocet  for pain  Reevaluation of the patient after these medicines showed that the patient improved I have reviewed the patients home medicines and have made adjustments as needed     Problem List / ED Course:  Joint pain Provided oxycodone  for pain with significant improvement.  Is sleeping comfortably following this No erythema, warmth, rash, drainage, fluctuance, gross swelling to indicate infection. No fever nor tachycardia in ED. No leukocytosis.  No signs of septic joint or cellulitis, gout Is generally weak  in all extremities.  No focal deficits.  No sensory deficits.  No complaints of dizziness, visual disturbances.  No suspicion of CVA/TIA Respiratory panel neg As he has not had his methotrexate for 1 entire week and is currently on day 5 following a week without methotrexate, think that symptoms are likely related to RA.  Typical distribution for RA Did provide short course of narcotic pain medicine for pain.  As he recently had ED visit for constipation 1 month ago, I stressed the importance of stool softeners, Metamucil, adequate hydration to avoid constipation.  I also discussed that he may use MiraLAX if he is unable to have a bowel movement in 2-3 days and how to appropriately use MiraLAX  Dysuria No flank pain.  No history of kidney stones.  No complaints of hematuria.  Low suspicion for kidney stone UA negative for infection.  No hemoglobin in urine. Low suspicion for pyelonephritis with noninfected urine, no CVA tenderness, no complaints of fever no fever in ED  Congestion No cough.  Lung sounds CTAB.  Low suspicion for pneumonia Respiratory panel negative No fever no tachycardia Symptomatic treatment   Reevaluation:  After the  interventions noted above, I reevaluated the patient and found that they have :improved     Dispostion:  After consideration of the diagnostic results and the patients response to treatment, I feel that the patent would benefit from outpatient management with analgesia, rheumatology follow-up.   Discussed ED workup, disposition, return to ED precautions with patient who expresses understanding agrees with plan.  All questions answered to their satisfaction.  They are agreeable to plan.  Discharge instructions provided on paperwork  Discussed patient with Dr. Cottie who reviewed ED workup and agrees with plan  Final diagnoses:  Polyarthralgia  Rheumatoid arthritis with positive rheumatoid factor, involving unspecified site [facility])    ED Discharge Orders          Ordered    oxyCODONE  (ROXICODONE ) 5 MG immediate release tablet  Every 6 hours PRN        04/09/24 2011             Minnie Tinnie BRAVO, PA 04/09/24 2348    Cottie Donnice PARAS, MD 04/10/24 1321
